# Patient Record
Sex: Female | Born: 1975
Health system: Southern US, Community
[De-identification: ages and names within clinical notes are randomized; demographics above are authoritative.]

## PROBLEM LIST (undated history)

## (undated) ENCOUNTER — Ambulatory Visit: Payer: 59

## (undated) DIAGNOSIS — E119 Type 2 diabetes mellitus without complications: Secondary | ICD-10-CM

## (undated) DIAGNOSIS — O24419 Gestational diabetes mellitus in pregnancy, unspecified control: Secondary | ICD-10-CM

## (undated) DIAGNOSIS — T7840XA Allergy, unspecified, initial encounter: Secondary | ICD-10-CM

## (undated) DIAGNOSIS — I10 Essential (primary) hypertension: Secondary | ICD-10-CM

## (undated) DIAGNOSIS — L309 Dermatitis, unspecified: Secondary | ICD-10-CM

## (undated) HISTORY — DX: Gestational diabetes mellitus in pregnancy, unspecified control: O24.419

## (undated) HISTORY — DX: Dermatitis, unspecified: L30.9

## (undated) HISTORY — DX: Type 2 diabetes mellitus without complications: E11.9

## (undated) HISTORY — DX: Essential (primary) hypertension: I10

## (undated) HISTORY — PX: EYE SURGERY: SHX253

## (undated) HISTORY — PX: WISDOM TOOTH EXTRACTION: SHX21

## (undated) HISTORY — DX: Allergy, unspecified, initial encounter: T78.40XA

---

## 2002-08-20 ENCOUNTER — Other Ambulatory Visit: Admission: RE | Admit: 2002-08-20 | Discharge: 2002-08-20 | Payer: Self-pay | Admitting: Internal Medicine

## 2004-02-04 ENCOUNTER — Other Ambulatory Visit: Admission: RE | Admit: 2004-02-04 | Discharge: 2004-02-04 | Payer: Self-pay | Admitting: Obstetrics and Gynecology

## 2004-03-03 ENCOUNTER — Inpatient Hospital Stay (HOSPITAL_COMMUNITY): Admission: AD | Admit: 2004-03-03 | Discharge: 2004-03-05 | Payer: Self-pay | Admitting: Obstetrics and Gynecology

## 2004-04-27 ENCOUNTER — Ambulatory Visit (HOSPITAL_COMMUNITY): Admission: RE | Admit: 2004-04-27 | Discharge: 2004-04-27 | Payer: Self-pay | Admitting: Obstetrics and Gynecology

## 2004-07-02 ENCOUNTER — Ambulatory Visit (HOSPITAL_COMMUNITY): Admission: RE | Admit: 2004-07-02 | Discharge: 2004-07-02 | Payer: Self-pay | Admitting: Obstetrics and Gynecology

## 2004-07-08 ENCOUNTER — Ambulatory Visit (HOSPITAL_COMMUNITY): Admission: RE | Admit: 2004-07-08 | Discharge: 2004-07-08 | Payer: Self-pay | Admitting: Obstetrics and Gynecology

## 2004-07-13 ENCOUNTER — Ambulatory Visit (HOSPITAL_COMMUNITY): Admission: RE | Admit: 2004-07-13 | Discharge: 2004-07-13 | Payer: Self-pay | Admitting: Obstetrics and Gynecology

## 2004-07-16 ENCOUNTER — Ambulatory Visit (HOSPITAL_COMMUNITY): Admission: RE | Admit: 2004-07-16 | Discharge: 2004-07-16 | Payer: Self-pay | Admitting: Obstetrics and Gynecology

## 2004-07-22 ENCOUNTER — Ambulatory Visit (HOSPITAL_COMMUNITY): Admission: RE | Admit: 2004-07-22 | Discharge: 2004-07-22 | Payer: Self-pay | Admitting: Obstetrics and Gynecology

## 2004-08-05 ENCOUNTER — Ambulatory Visit (HOSPITAL_COMMUNITY): Admission: RE | Admit: 2004-08-05 | Discharge: 2004-08-05 | Payer: Self-pay | Admitting: Obstetrics and Gynecology

## 2004-08-09 ENCOUNTER — Ambulatory Visit (HOSPITAL_COMMUNITY): Admission: RE | Admit: 2004-08-09 | Discharge: 2004-08-09 | Payer: Self-pay | Admitting: Obstetrics and Gynecology

## 2004-08-12 ENCOUNTER — Inpatient Hospital Stay (HOSPITAL_COMMUNITY): Admission: AD | Admit: 2004-08-12 | Discharge: 2004-08-15 | Payer: Self-pay | Admitting: Obstetrics and Gynecology

## 2004-08-16 ENCOUNTER — Encounter: Admission: RE | Admit: 2004-08-16 | Discharge: 2004-09-15 | Payer: Self-pay | Admitting: Obstetrics and Gynecology

## 2004-09-16 ENCOUNTER — Encounter: Admission: RE | Admit: 2004-09-16 | Discharge: 2004-10-16 | Payer: Self-pay | Admitting: Obstetrics and Gynecology

## 2004-11-16 ENCOUNTER — Encounter: Admission: RE | Admit: 2004-11-16 | Discharge: 2004-12-16 | Payer: Self-pay | Admitting: Obstetrics and Gynecology

## 2004-12-17 ENCOUNTER — Encounter: Admission: RE | Admit: 2004-12-17 | Discharge: 2005-01-15 | Payer: Self-pay | Admitting: Obstetrics and Gynecology

## 2005-01-16 ENCOUNTER — Encounter: Admission: RE | Admit: 2005-01-16 | Discharge: 2005-02-15 | Payer: Self-pay | Admitting: Obstetrics and Gynecology

## 2005-02-16 ENCOUNTER — Encounter: Admission: RE | Admit: 2005-02-16 | Discharge: 2005-03-17 | Payer: Self-pay | Admitting: Obstetrics and Gynecology

## 2005-02-19 IMAGING — US US OB DETAIL+14 WK
1 series · 13 of 28 positions shown · non-contrast
Comparison: none

CLINICAL DATA: 23 week 2 day gestational age by LMP.  Insulin dependent diabetes.

[Series 1: us ob detail+14 wk · 0.31mm/px · 13 of 89 slices shown]
[im 4/89]
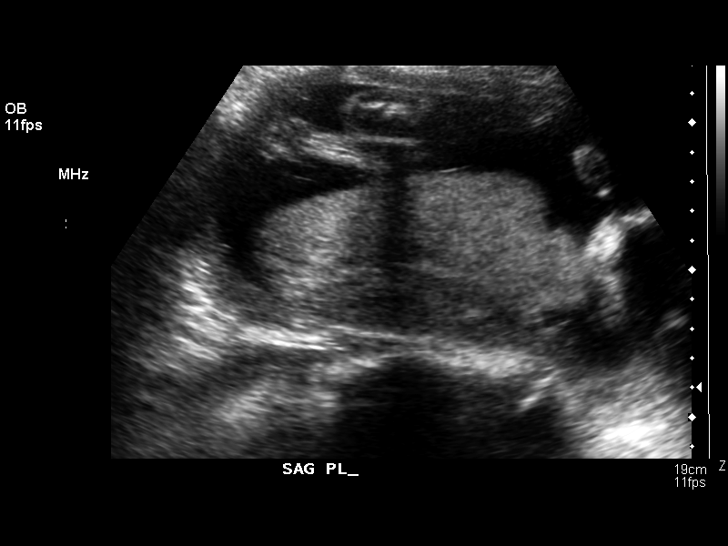
[im 10/89]
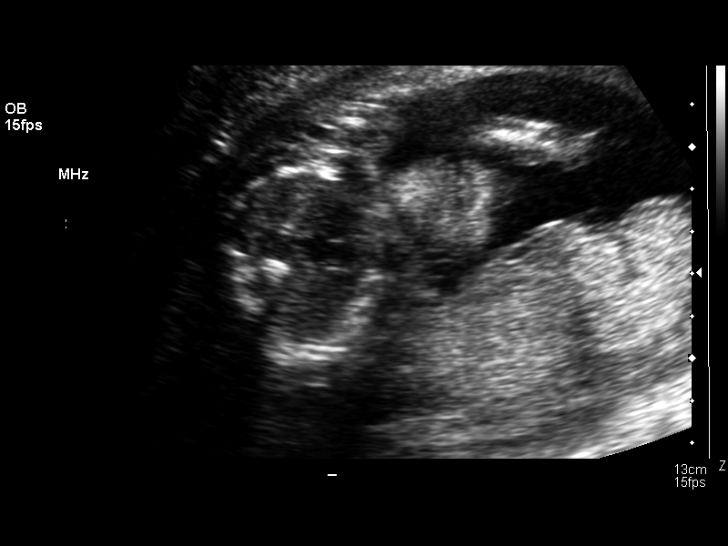
[im 17/89]
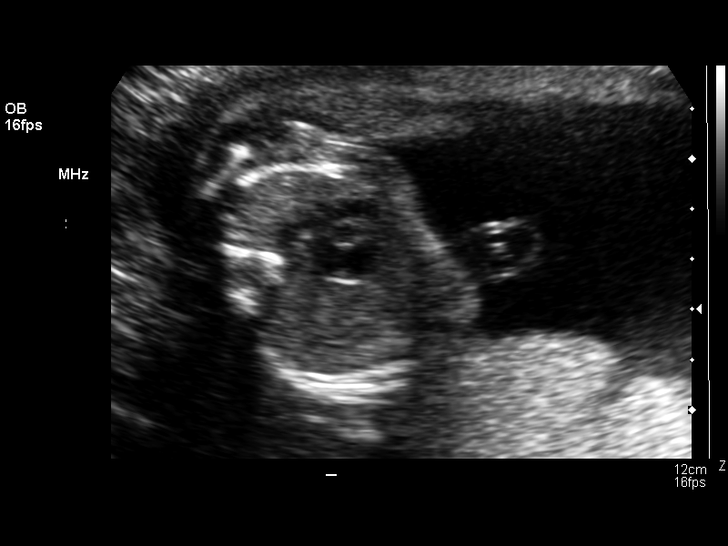
[im 23/89]
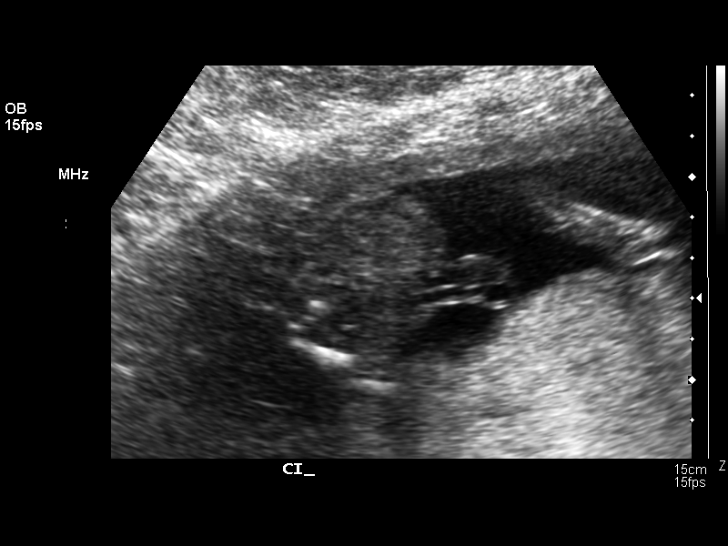
[im 30/89]
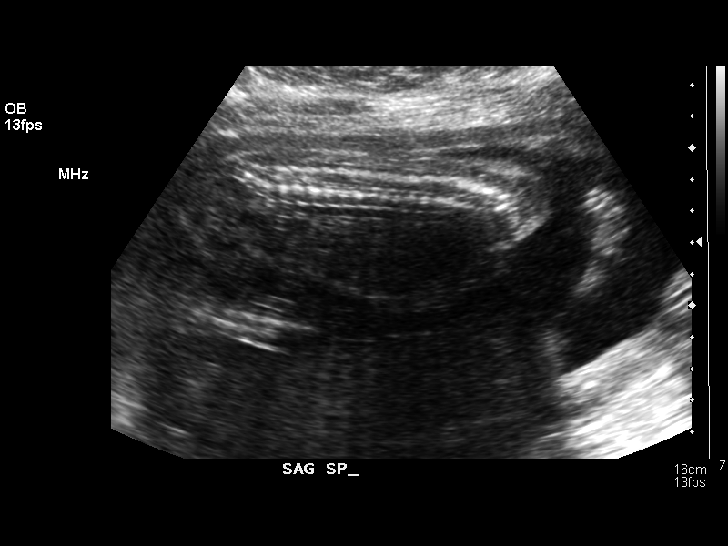
[im 36/89]
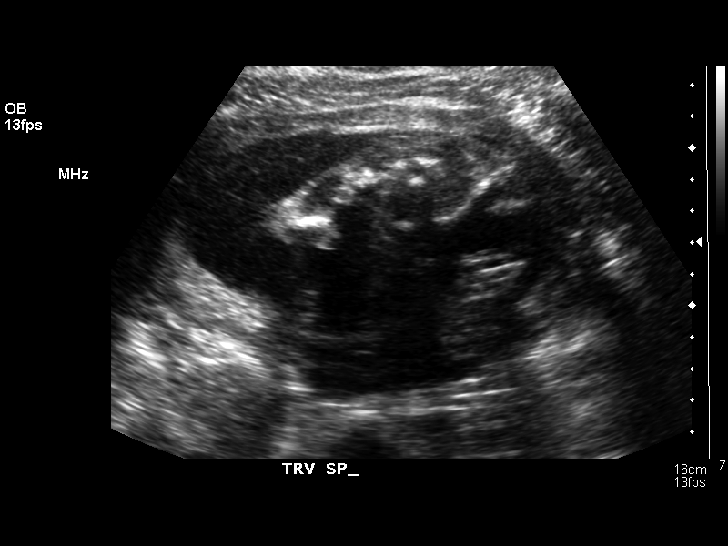
[im 46/89]
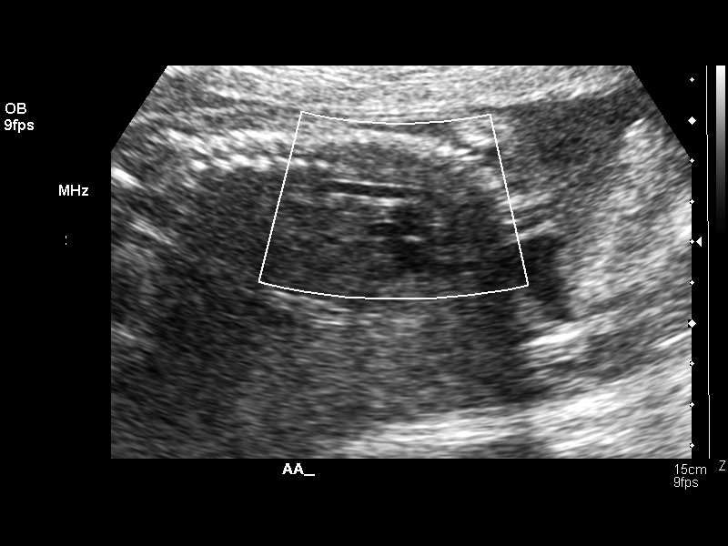
[im 53/89]
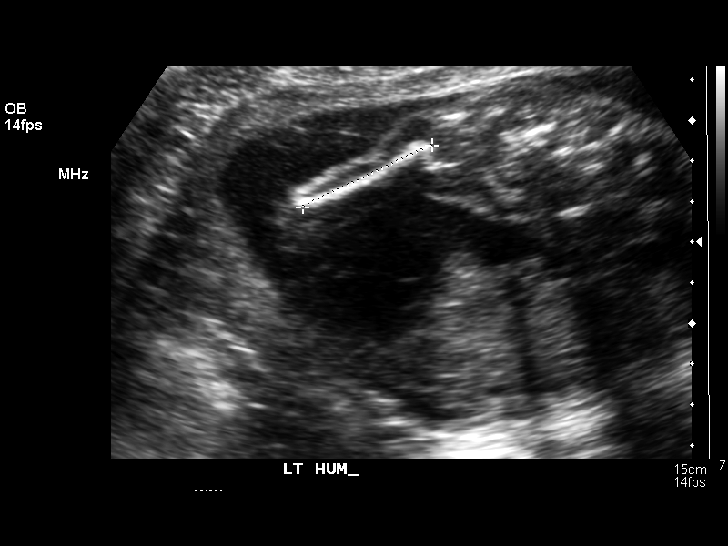
[im 59/89]
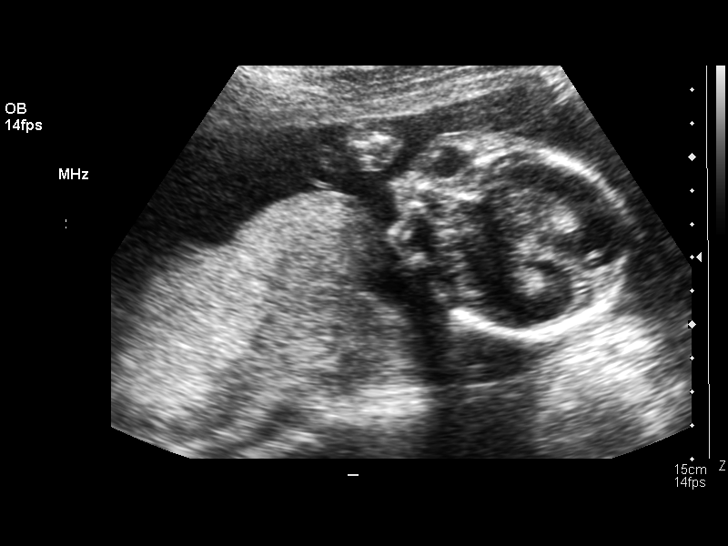
[im 66/89]
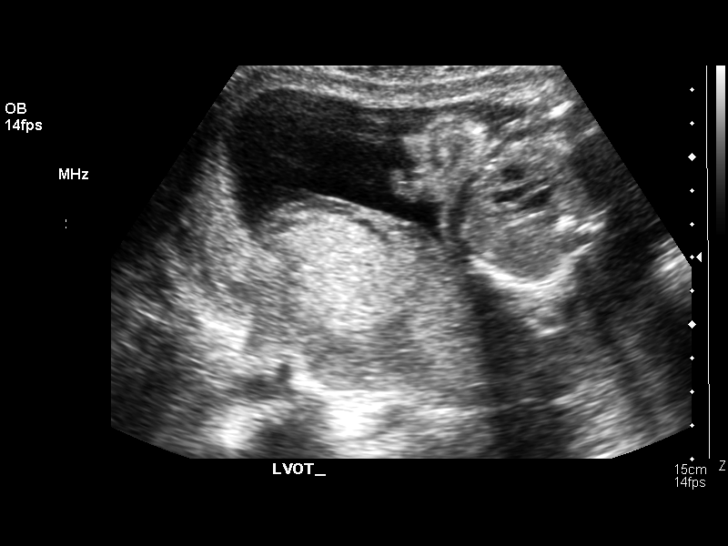
[im 72/89]
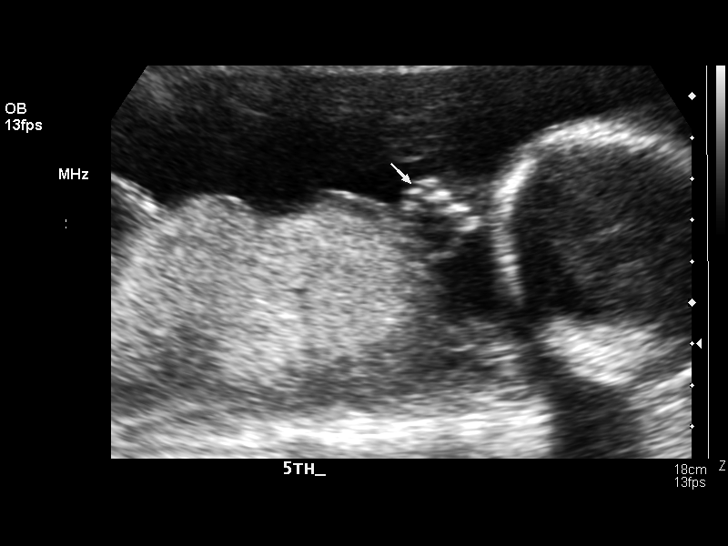
[im 79/89]
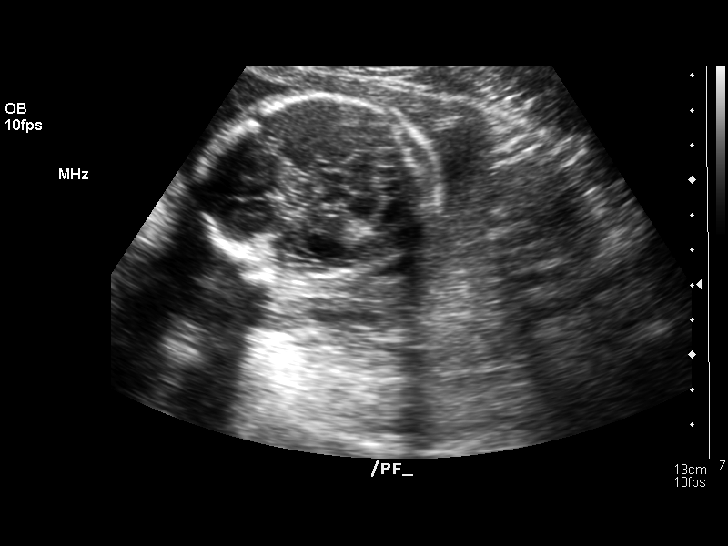
[im 85/89]
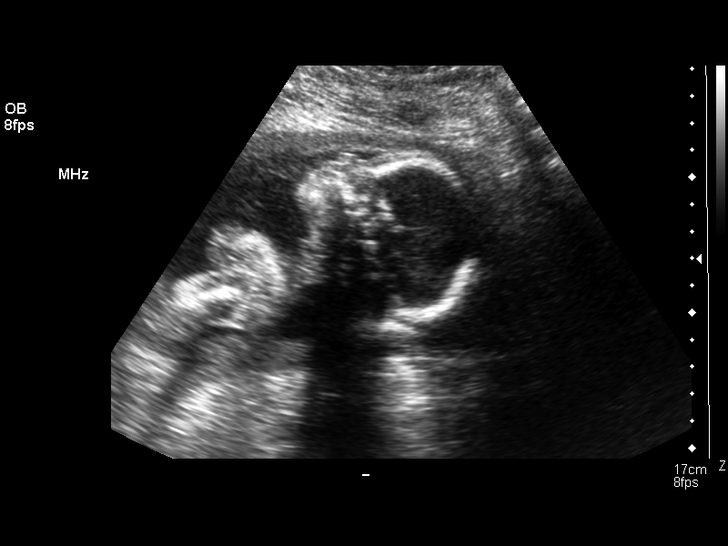

[13 of 28 positions shown; findings below may reference images not displayed]

DETAILED OBSTETRICAL ULTRASOUND:

Number of Fetuses: 1
Heart Rate:  150
Movement:  Yes
Breathing:  No
Presentation:  Cephalic
Placental Location:  Posterior
Grade:  I
Previa:  No
Amniotic Fluid (Subjective):  Normal
Amniotic Fluid (Objective):  4.2 cm Vertical pocket 

FETAL BIOMETRY
BPD:  5.5 cm  22 w 6 d
HC:   20.3 cm   22 w 3 d
AC:  17.4 cm   22 w 2 d
FL:  3.9 cm   22 w 4 d
HL:  3.7 cm  22 w 5 d

MEAN GA:  22 w 4 d

FETAL ANATOMY
Lateral Ventricles:  Visualized  
Thalami/CSP:  Visualized 
Posterior Fossa:  Visualized 
Nuchal Region:  N/A
Spine:  Visualized  
4 Chamber Heart on Left:  Visualized 
Stomach on Left:  Visualized 
3 Vessel Cord:  Visualized 
Cord Insertion Site:  Visualized 
Kidneys:  Visualized 
Bladder:  Visualized 
Extremities:  Visualized 

ADDITIONAL ANATOMY VISUALIZED:  LVOT, RVOT, upper lip, orbits, profile, diaphragm, 5th digit, male genitalia, and nasal bone.

Evaluation limited by:  Fetal position

MATERNAL UTERINE AND ADNEXAL FINDINGS
Cervix:  3.8 cm Transabdominally
IMPRESSION: 1.  Single living intrauterine fetus with mean gestational age of 22 weeks 4 days and sonographic EDC of 08/27/04.  This is within one week of LMP dating.  
2.  No evidence of fetal anatomic abnormality.  

</u12:p>

## 2005-03-18 ENCOUNTER — Encounter: Admission: RE | Admit: 2005-03-18 | Discharge: 2005-04-17 | Payer: Self-pay | Admitting: Obstetrics and Gynecology

## 2005-04-18 ENCOUNTER — Encounter: Admission: RE | Admit: 2005-04-18 | Discharge: 2005-05-18 | Payer: Self-pay | Admitting: Obstetrics and Gynecology

## 2005-05-02 IMAGING — US US OB FOLLOW-UP
1 series · 13 of 28 positions shown · non-contrast
Comparison: none

CLINICAL DATA: Estimated fetal weight and biophysical profile.

[Series 1: us ob follow-up · 13 of 86 slices shown]
[im 4/86]
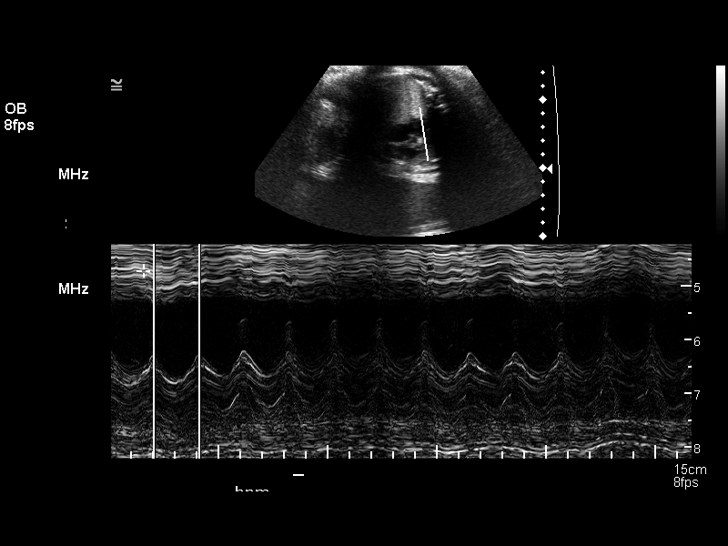
[im 10/86]
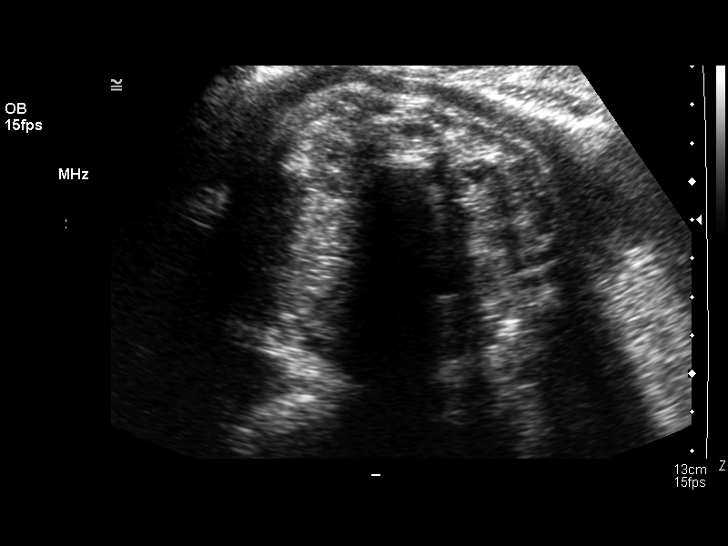
[im 16/86]
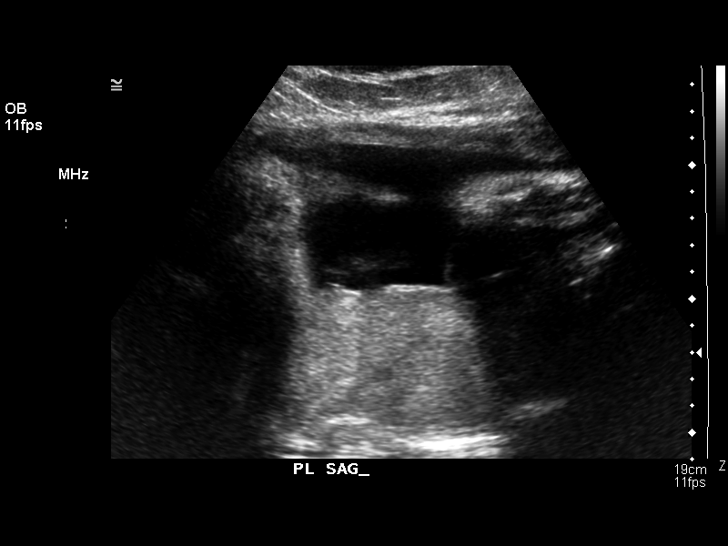
[im 23/86]
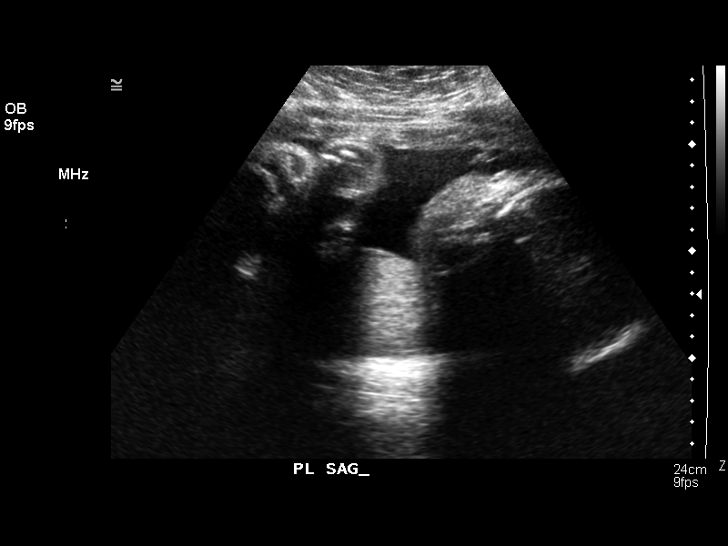
[im 29/86]
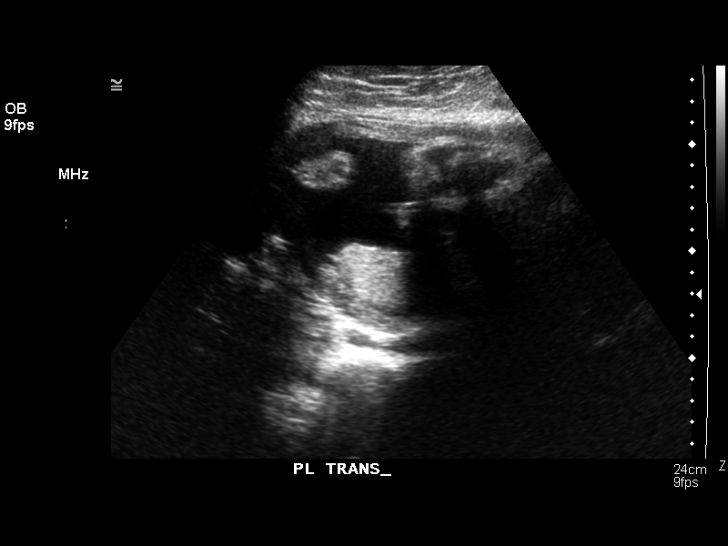
[im 35/86]
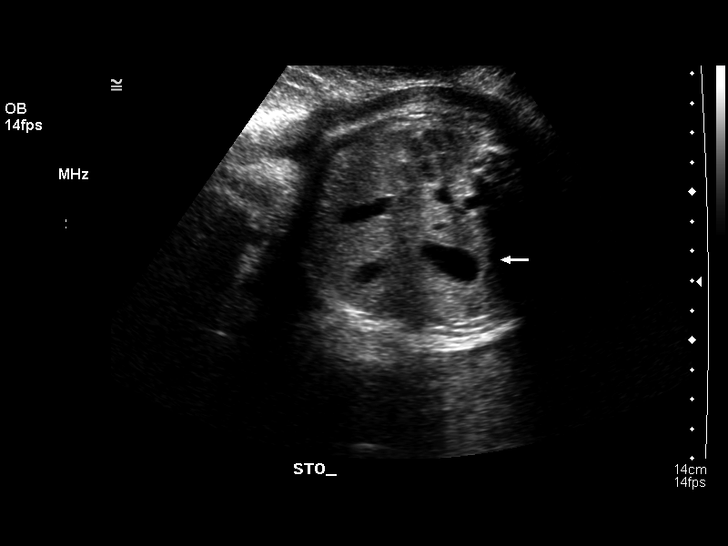
[im 45/86]
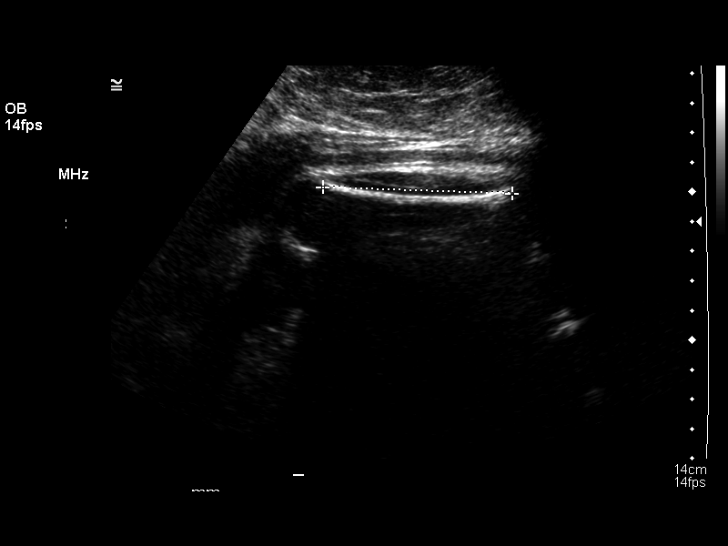
[im 51/86]
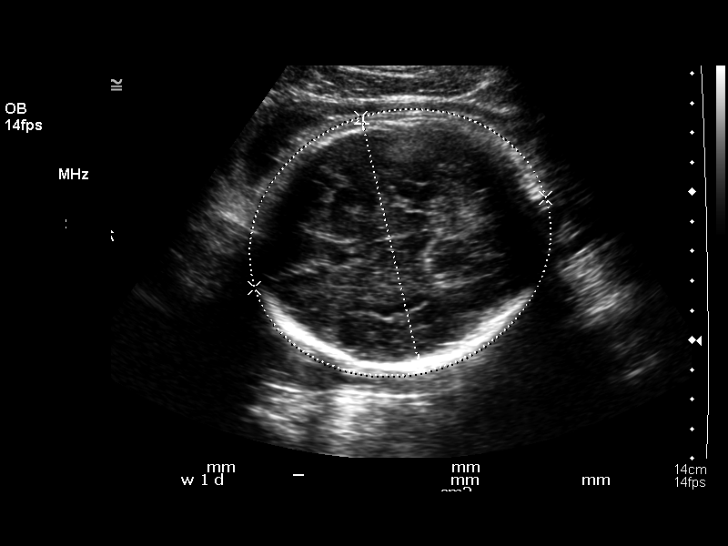
[im 57/86]
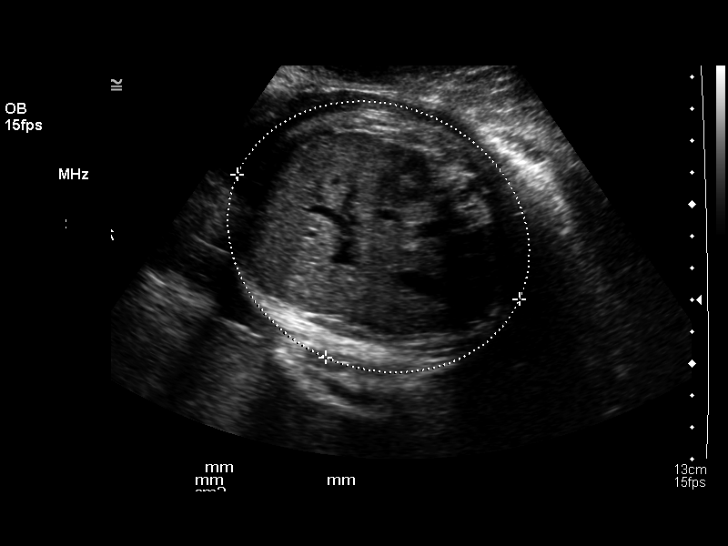
[im 63/86]
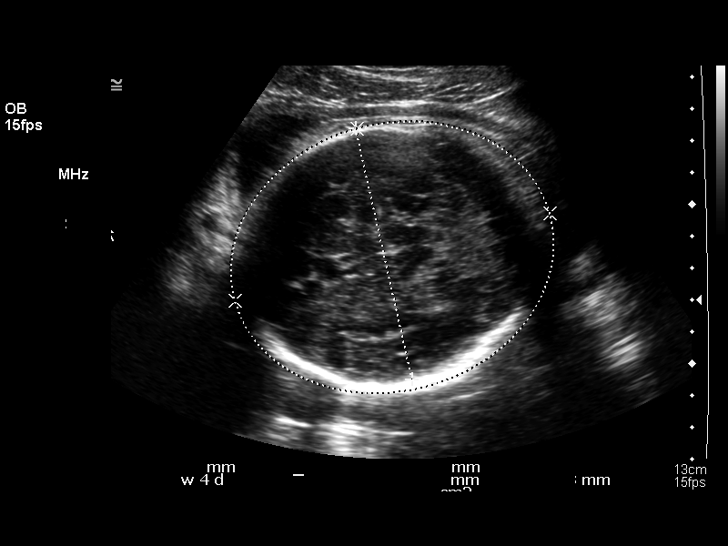
[im 70/86]
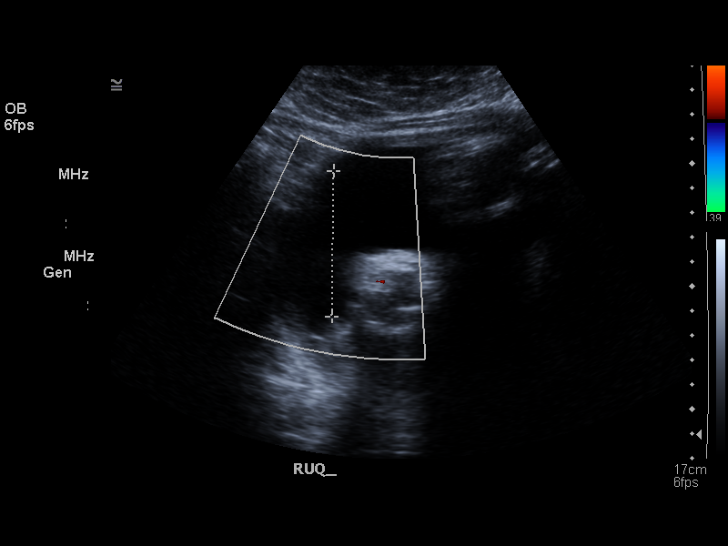
[im 76/86]
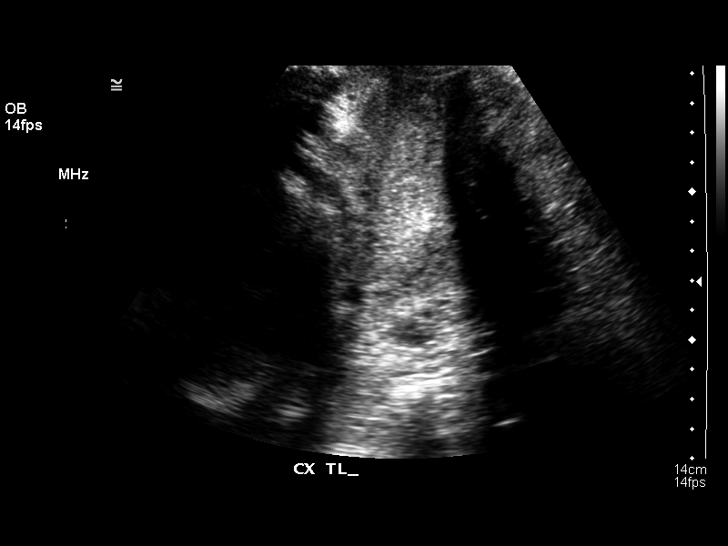
[im 82/86]
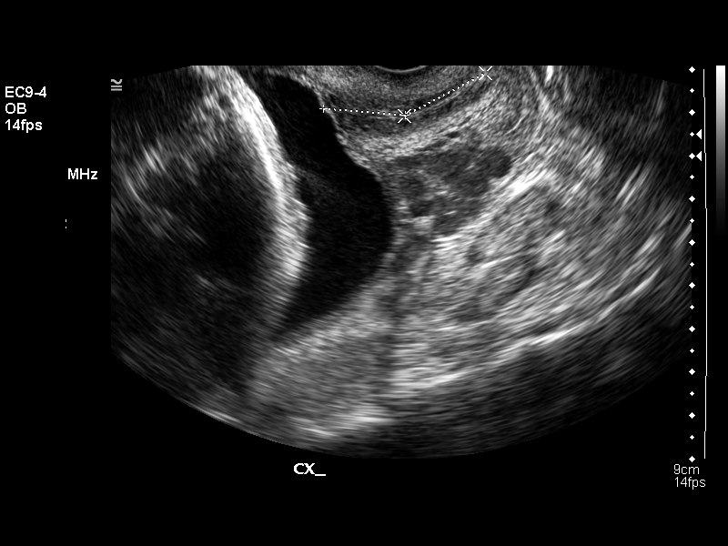

[13 of 28 positions shown; findings below may reference images not displayed]

OBSTETRICAL ULTRASOUND RE-EVALUATION WITH TRANSVAGINAL:
 Number of Fetuses:  1
 Heart Rate:  144
 Movement:  Yes
 Breathing:  Yes
 Presentation:  Cephalic
 Placental Location:  Posterior
 Grade:  II
 Previa:  No
 Amniotic Fluid (subjective):  Normal
 Amniotic Fluid (objective):  17.0 cm AFI (5th -95th%ile = 8.3 – 24.5 cm for 33 wks)

 FETAL BIOMETRY
 BPD:  8.3 cm   33 w 2 d
 HC:  29.7 cm   32 w 6 d
 AC:  28.3 cm   32 w 2 d
 FL:   6.4 cm   32 w 6 d

 Mean GA:  32 w 6 d
 Assigned GA:  33 w 4 d

 EFW:  4446 g (H) 50th – 75th%ile (9593 – 3914 g) For 33 wks

 FETAL ANATOMY
 Lateral Ventricles:  Previously seen 
 Thalami/CSP:  Previously seen 
 Posterior Fossa:  Previously seen 
 Nuchal Region:  Previously seen 
 Spine:  Previously seen 
 4 Chamber Heart on Left:  Visualized 
 Stomach on Left:  Visualized 
 3 Vessel Cord:  Previously seen 
 Cord Insertion Site:  Previously seen 
 Kidneys:  Visualized 
 Bladder:  Visualized 
 Extremities:  Previously seen 

 Evaluation limited by:  Maternal habitus and advanced gestational age.

 MATERNAL UTERINE AND ADNEXAL FINDINGS
 Cervix:  4.0 cm Transvaginally

 BIOPHYSICAL PROFILE

 Movement:  2    Time:  30 minutes
 Breathing:  2
 Tone:  2
 Amniotic Fluid:  2

 Total Score:  8
IMPRESSION: 1.  Single living intrauterine fetus in cephalic presentation with subjectively and quantitatively normal amniotic fluid volume.  Interval growth has been appropriate.
 2.  Visualized fetal anatomy is unremarkable although exam is limited by the advanced gestational age and maternal habitus.
 3.  Biophysical profile score is [DATE] after 30 minutes of observation.

## 2005-05-16 IMAGING — US US FETAL BPP W/O NONSTRESS
1 series · 14 of 19 positions shown · non-contrast
Comparison: none

CLINICAL DATA: Hypertension and diabetes.  Assess fetal well-being.

[Series 1: us fetal bpp w/o nonstress · 0.41mm/px · 19 acquisitions, 14 frames shown]
[im 1/19]
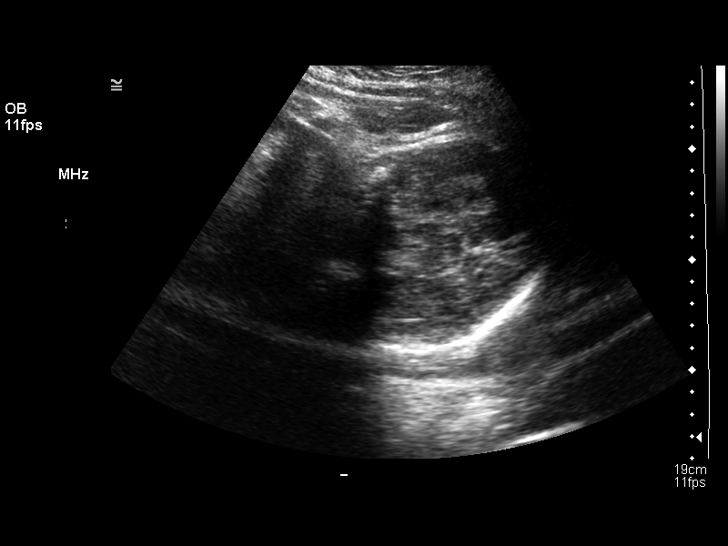
[im 3/19]
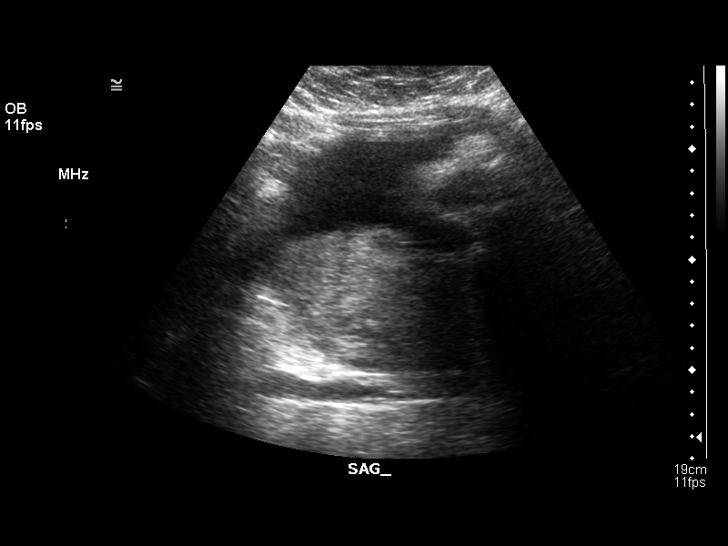
[im 4/19]
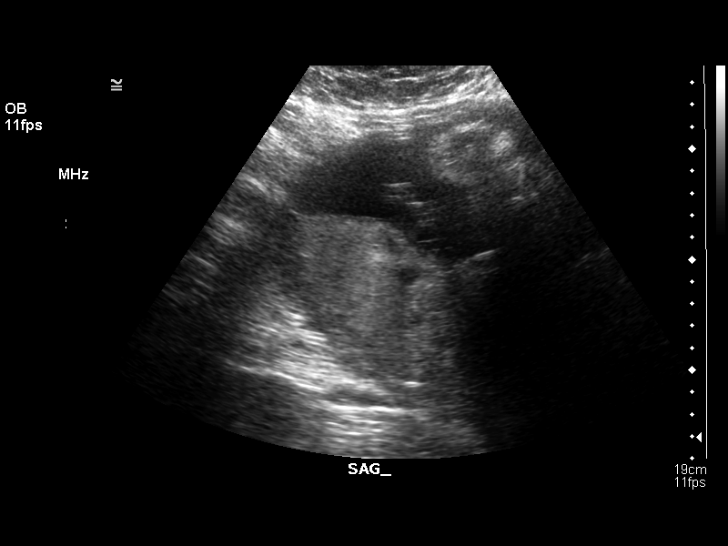
[im 5/19]
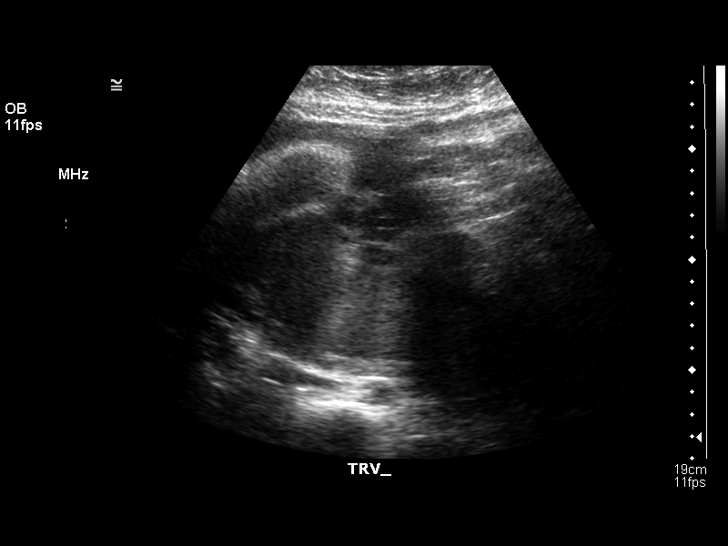
[im 7/19]
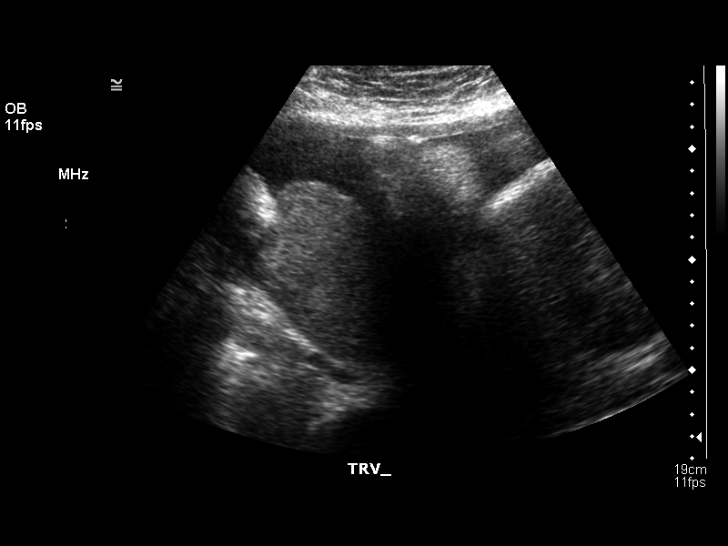
[im 8/19]
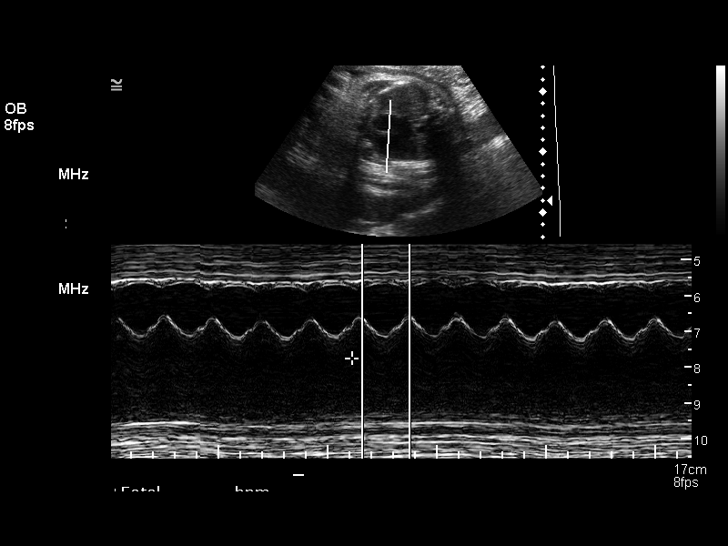
[im 9/19]
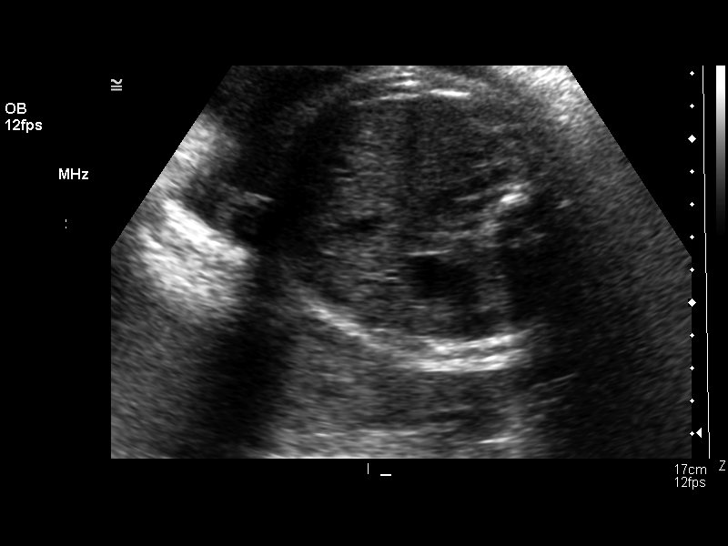
[im 11/19]
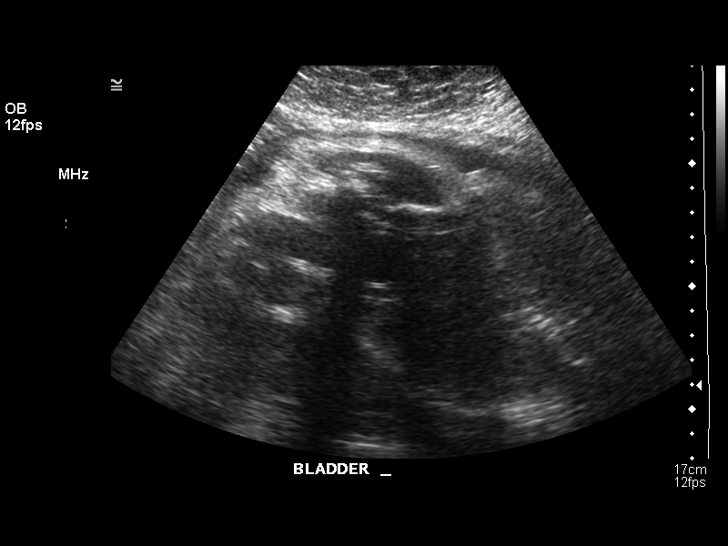
[im 12/19]
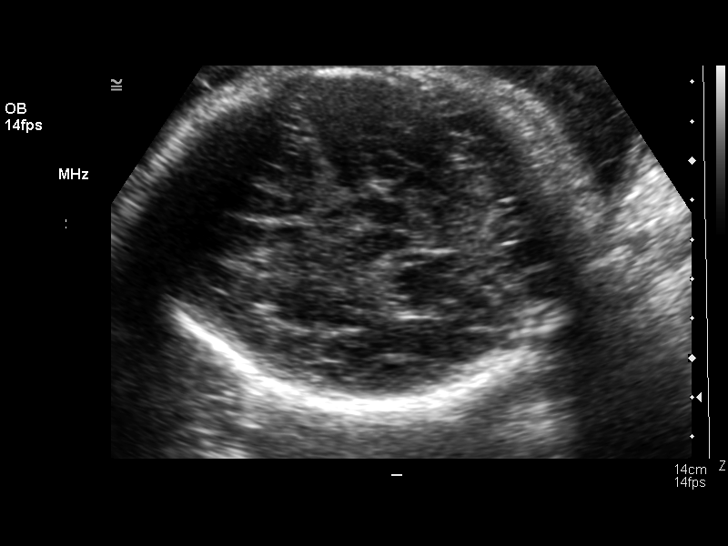
[im 13/19]
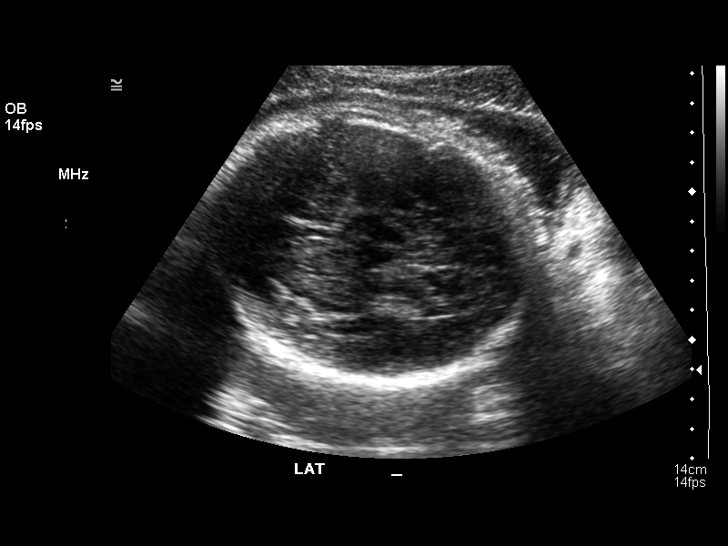
[im 15/19]
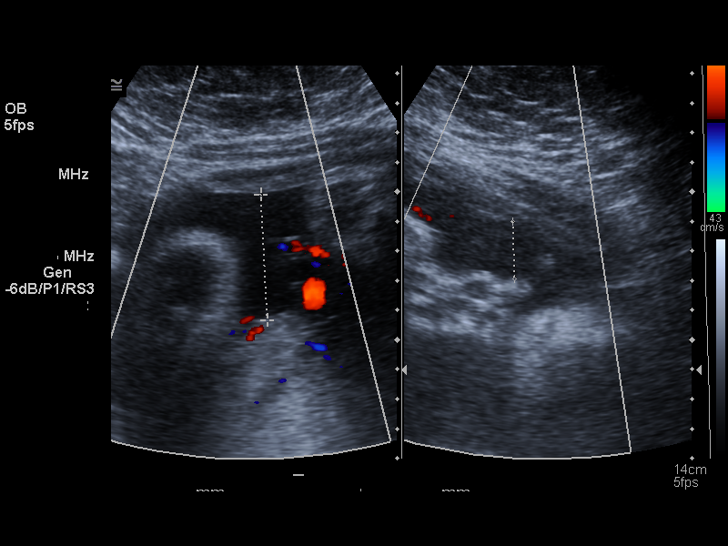
[im 16/19]
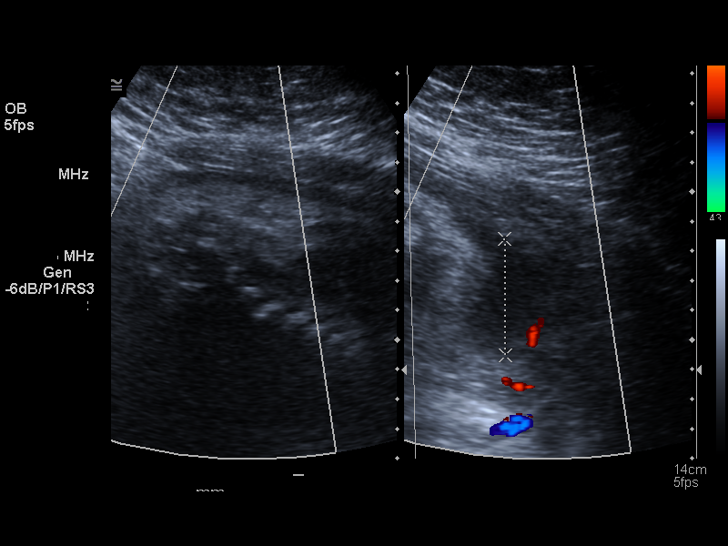
[im 17/19]
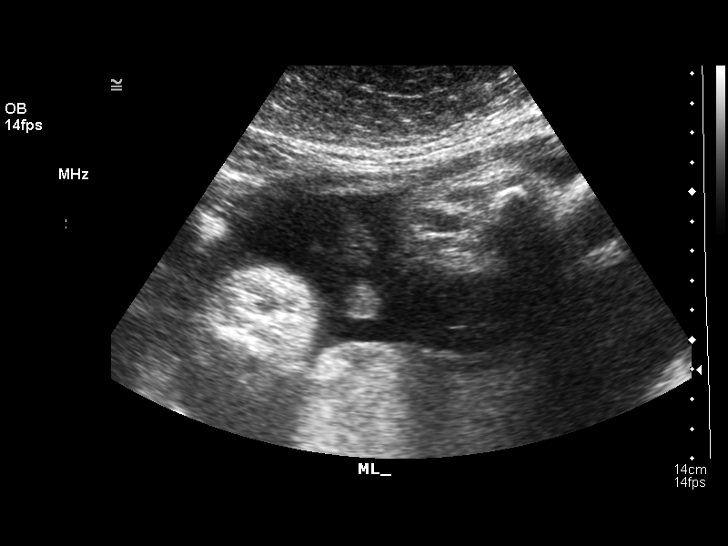
[im 19/19]
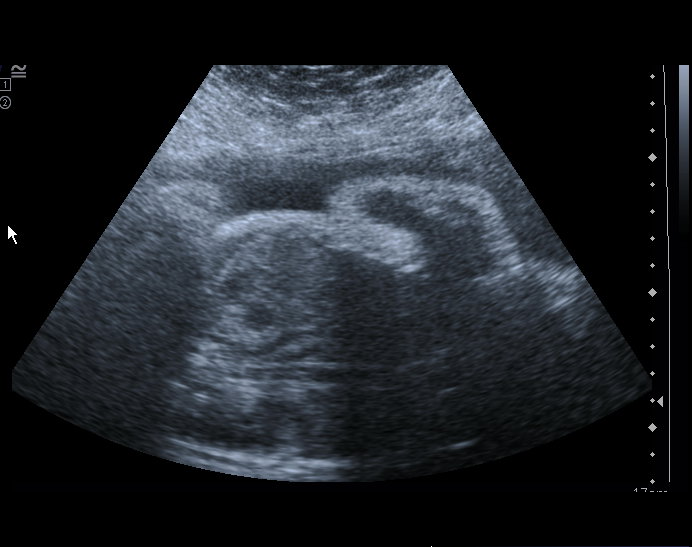

[14 of 19 positions shown; findings below may reference images not displayed]

BIOPHYSICAL PROFILE

Number of Fetuses:  1
Heart rate:  139 bpm
Movement:  yes
Breathing:  yes
Presentation:  vertex
Placental Location:  posterior, right lateral
Grade:  1
Previa:  no
Amniotic Fluid (Subjective):  normal
Amniotic Fluid (Objective):  AFI 10.1 cm (1th-71th %ile = 7.7 to 24.9 cm for 36 weeks) 

Fetal measurements and complete anatomic evaluation were not requested.  The following fetal anatomy was visualized on this exam:  lateral ventricles, thalami, stomach, three vessel cord, kidneys, bladder, and diaphragm.

BPP SCORING
Movements:  2  Time:  15 minutes
Breathing:  2
Tone:  2
Amniotic Fluid:  2
Total Score:  8

MATERNAL UTERINE AND ADNEXAL FINDINGS
Cervix:  not evaluated; evaluated in office on [REDACTED] per patient
IMPRESSION: 1.  Biophysical profile score [DATE].
2.  Subjectively and quantitatively normal amniotic fluid volume.
3.  No late developing fetal anatomic abnormalities are noted associated with the lateral ventricles, kidneys, or stomach.  A four-chamber heart view cannot be seen with confidence due to positioning on today?s exam.

## 2005-05-19 ENCOUNTER — Encounter: Admission: RE | Admit: 2005-05-19 | Discharge: 2005-06-15 | Payer: Self-pay | Admitting: Obstetrics and Gynecology

## 2005-06-03 IMAGING — US US FETAL BPP W/O NONSTRESS
1 series · 14 of 26 positions shown · non-contrast
Comparison: none

CLINICAL DATA: Non-reactive NST.   Assess fetal well-being.

[Series 1: us fetal bpp w/o nonstress · 0.33mm/px · 14 of 26 slices shown]
[im 1/26]
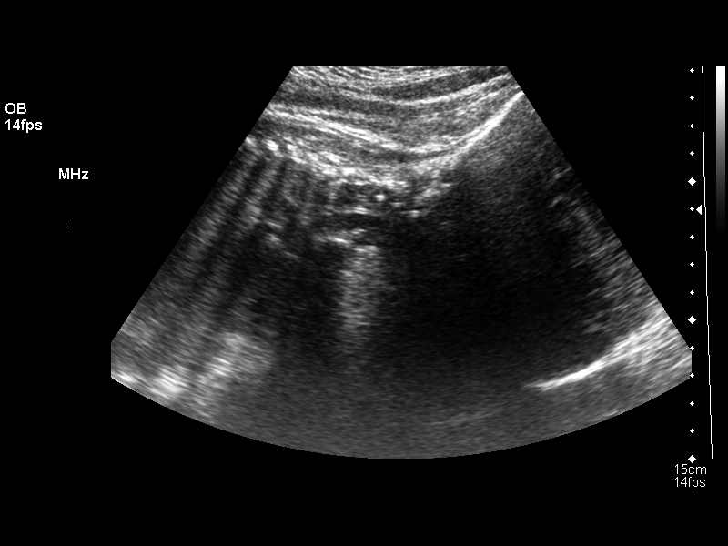
[im 3/26]
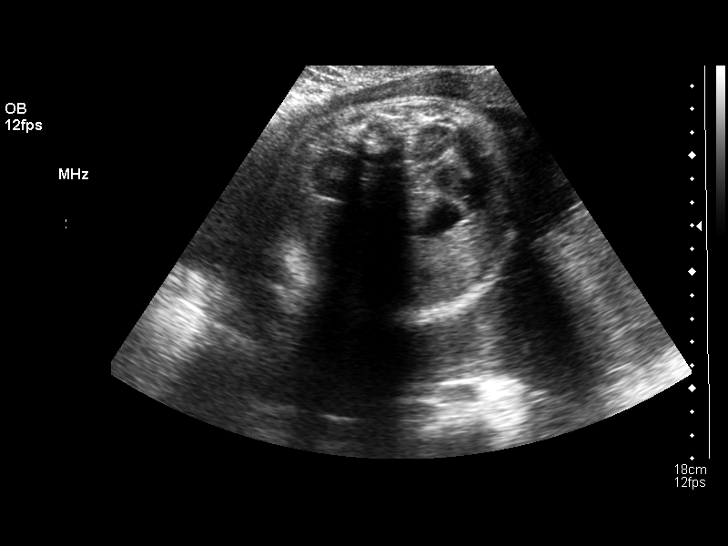
[im 5/26]
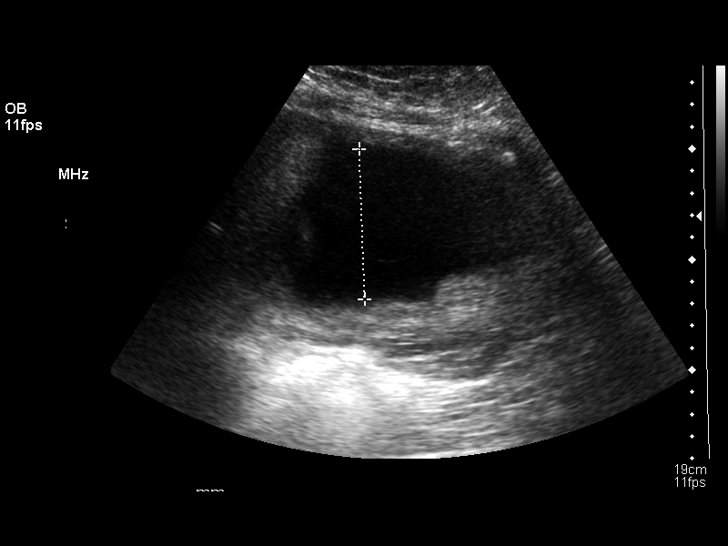
[im 7/26]
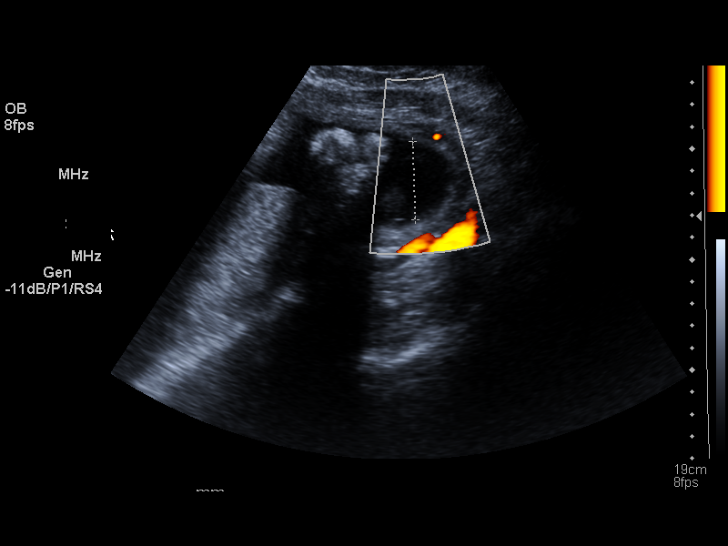
[im 9/26]
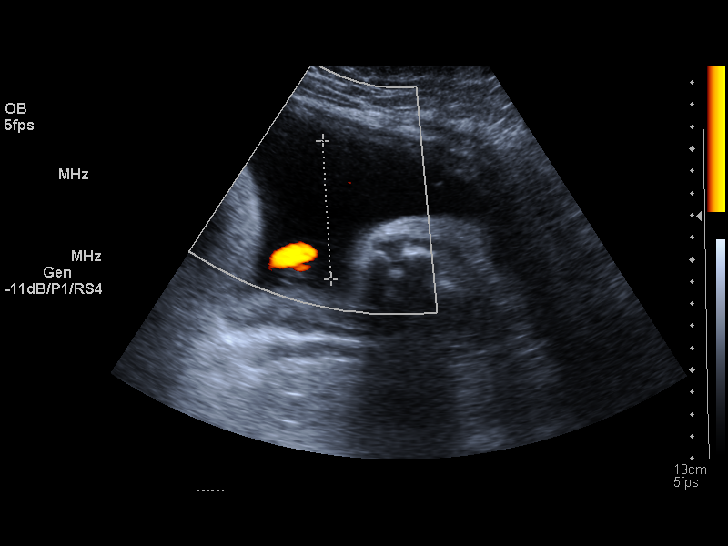
[im 11/26]
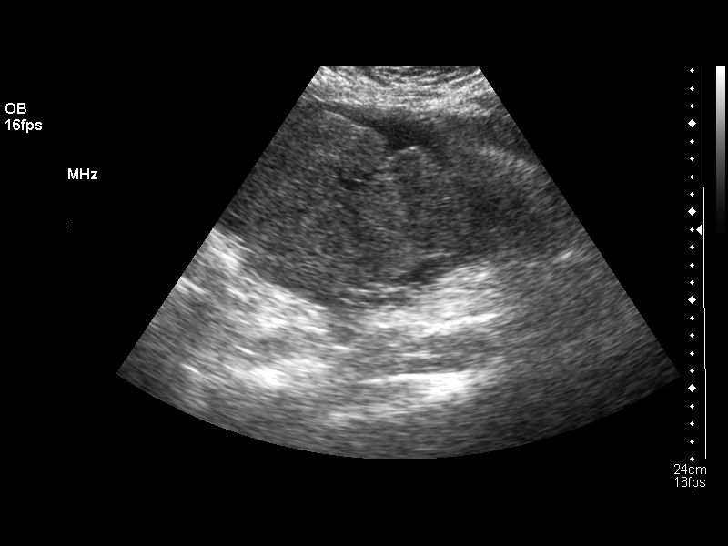
[im 13/26]
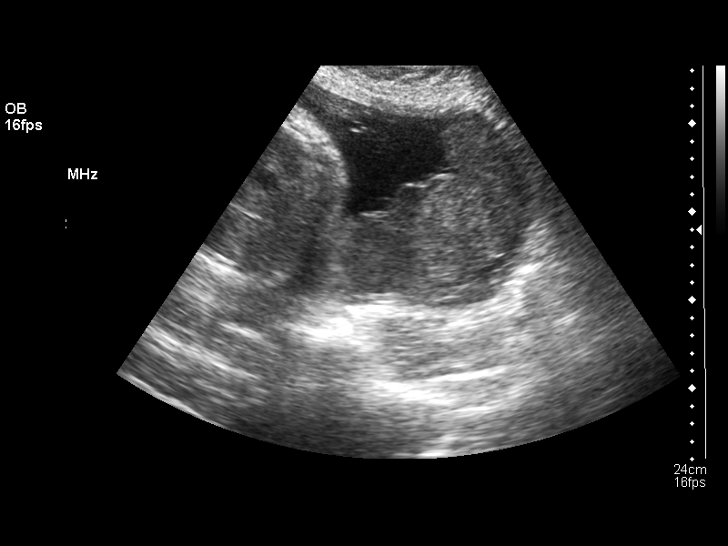
[im 14/26]
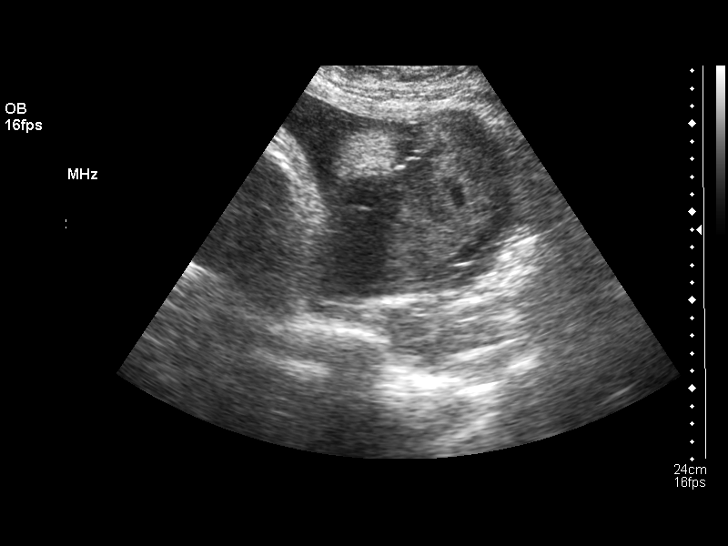
[im 16/26]
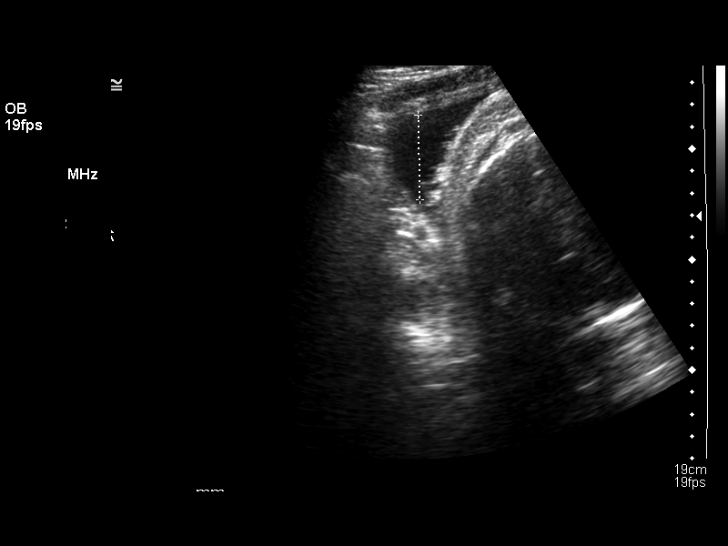
[im 18/26]
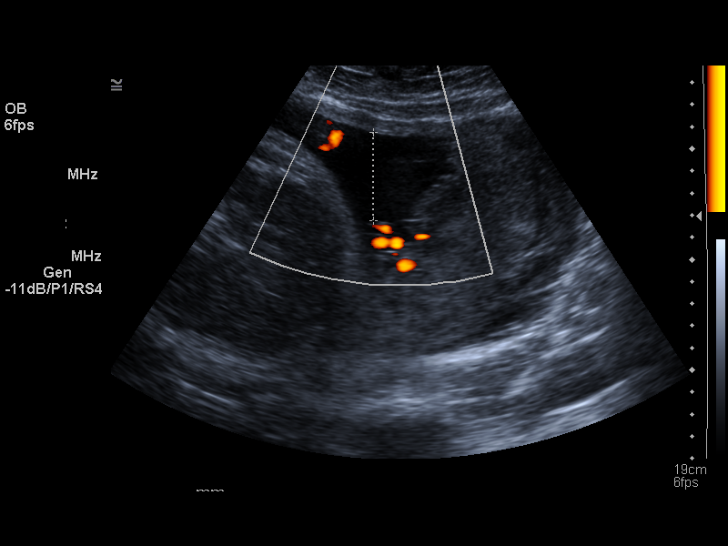
[im 20/26]
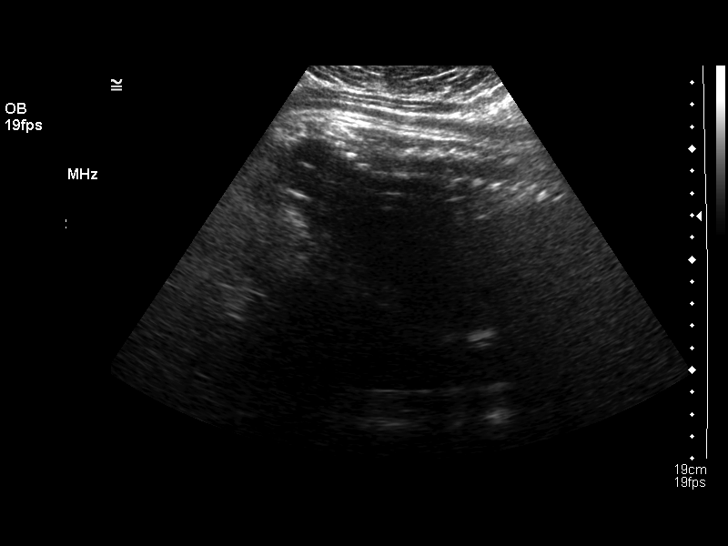
[im 22/26]
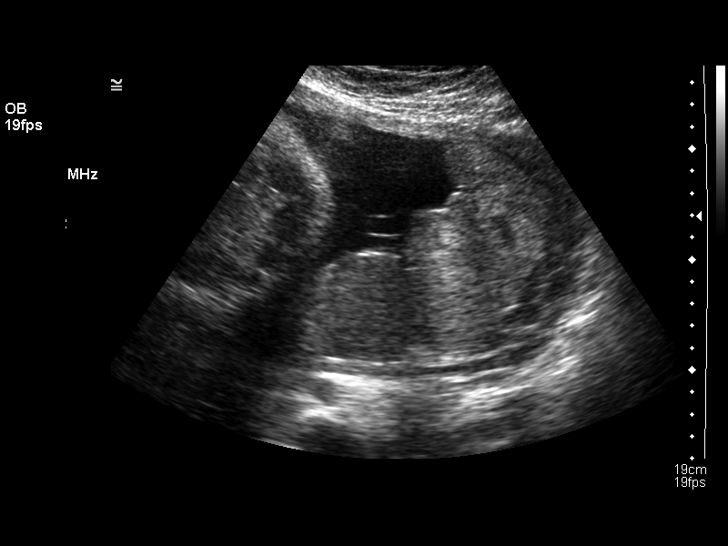
[im 24/26]
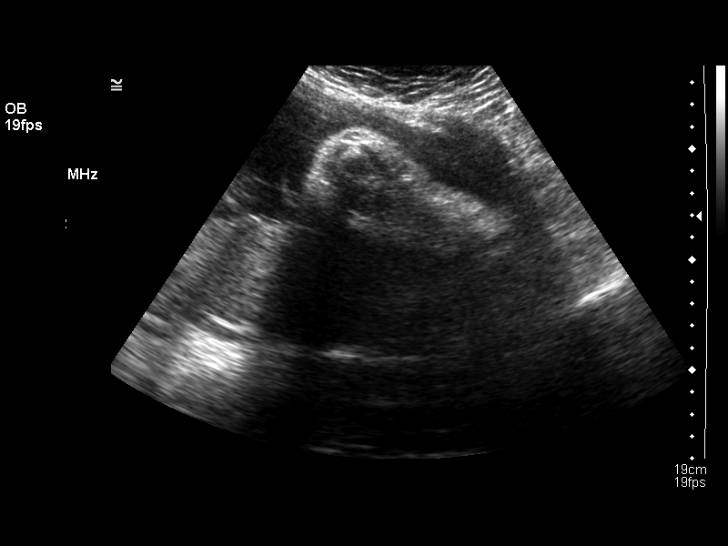
[im 26/26]
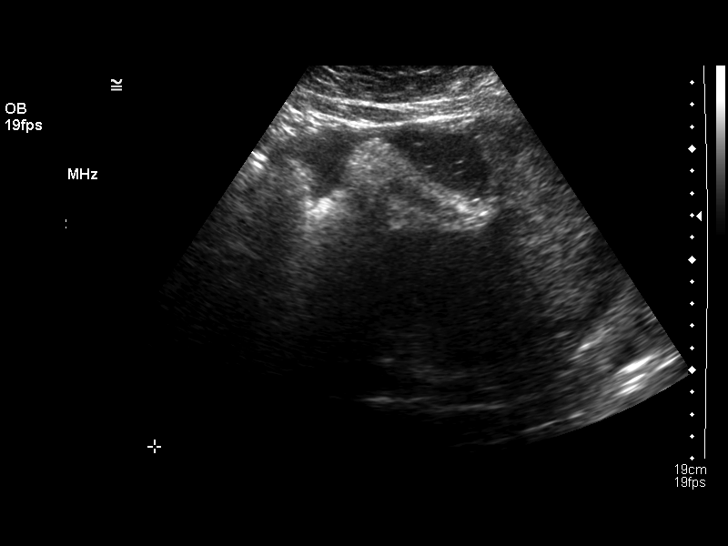

[14 of 26 positions shown; findings below may reference images not displayed]

BIOPHYSICAL PROFILE

Number of Fetuses:  1
Heart rate:  136
Movement:  Yes
Breathing:  Yes
Presentation:  Cephalic
Placental Location:  Fundal, posterior and left lateral 
Grade:  II
Previa:  No
Amniotic Fluid (Subjective):  Normal
Amniotic Fluid (Objective):  16.2 cm AFI (5th -95th%ile = 7.3 ? 23.9 cm for 38 wks)

Fetal measurements and complete anatomic evaluation were not requested.  The following fetal anatomy was visualized on this exam:  Stomach, kidneys and bladder.

BPP SCORING
Movements:  2  Time: 20 minutes
Breathing:  2
Tone:  2
Amniotic Fluid:  2
Total Score:  8

MATERNAL UTERINE AND ADNEXAL FINDINGS
Cervix:  Not evaluated.
IMPRESSION: 1.  Subjectively and quantitatively normal amniotic fluid volume.
2.  Biophysical profile score [DATE].
3.  No late developing fetal anatomic abnormalities are identified associated with the stomach, kidneys or bladder.  The lateral ventricles and four chamber heart view could not be seen with confidence due to positioning on today?s exam. 
4.  Today?s scan findings were called to Dr. Pencil?[REDACTED].

## 2005-06-16 ENCOUNTER — Encounter: Admission: RE | Admit: 2005-06-16 | Discharge: 2005-07-16 | Payer: Self-pay | Admitting: Obstetrics and Gynecology

## 2005-07-17 ENCOUNTER — Encounter: Admission: RE | Admit: 2005-07-17 | Discharge: 2005-08-15 | Payer: Self-pay | Admitting: Obstetrics and Gynecology

## 2005-08-16 ENCOUNTER — Encounter: Admission: RE | Admit: 2005-08-16 | Discharge: 2005-09-15 | Payer: Self-pay | Admitting: Obstetrics and Gynecology

## 2008-05-16 ENCOUNTER — Emergency Department (HOSPITAL_COMMUNITY): Admission: EM | Admit: 2008-05-16 | Discharge: 2008-05-16 | Payer: Self-pay | Admitting: Family Medicine

## 2010-04-16 ENCOUNTER — Emergency Department (HOSPITAL_COMMUNITY)
Admission: EM | Admit: 2010-04-16 | Discharge: 2010-04-16 | Payer: Self-pay | Source: Home / Self Care | Admitting: Family Medicine

## 2010-05-02 ENCOUNTER — Encounter: Payer: Self-pay | Admitting: Obstetrics and Gynecology

## 2010-08-27 NOTE — Discharge Summary (Signed)
NAME:  CHARLISE, GIOVANETTI          ACCOUNT NO.:  [id number]   MEDICAL RECORD NO.:  [id number]          PATIENT TYPE:  INP   LOCATION:  9303                          FACILITY:  [facility]   PHYSICIAN:  Naima A. Dillard, M.D. DATE OF BIRTH:  11-08-1975   DATE OF ADMISSION:  03/03/2004  DATE OF DISCHARGE:  03/05/2004                                 DISCHARGE SUMMARY   ADDENDUM:   DISCHARGE MEDICATIONS:  Her discharge medications are 17 units of Regular  insulin plus 9 units of NPH insulin every a.m., in addition to her 12 units  of NPH insulin at h.s.     Larey Brick   MLW/MEDQ  D:  03/06/2004  T:  03/06/2004  Job:  [id number]

## 2010-08-27 NOTE — H&P (Signed)
NAMEMONAE, TOPPING          ACCOUNT NO.:  [id number]   MEDICAL RECORD NO.:  [id number]           PATIENT TYPE:   LOCATION:                                 FACILITY:   PHYSICIAN:  Concha Pyo. Duplantis, C.N.M.DATE OF BIRTH:  [birth year]   DATE OF ADMISSION:  08/12/2004  DATE OF DISCHARGE:                                HISTORY & PHYSICAL   HISTORY OF PRESENT ILLNESS:  Ms. Patricia Estrada is a 35 year old married black  female, gravida 1, para 0, with a due date of Aug 22, 2004 by LMP, which  makes her approximately 38.[redacted] weeks gestation.  She is admitted for induction  of labor secondary to gestational hypertension versus chronic hypertension,  and type 2 diabetes diagnosed during this pregnancy, requiring insulin for  control.  She denies any regular uterine contractions.  She denies any  leaking or vaginal bleeding.  She reports positive fetal movement.  She  denies any headache, nausea, vomiting, or visual disturbances.  Her  pregnancy has been followed at [facility] by the M.D. service,  and is at risk for a history of hypertension throughout this pregnancy,  requiring Aldomet 250 mg p.o. b.i.d. since about [redacted] weeks gestation, and was  diagnosed also with insulin-dependent diabetes at about [redacted] weeks gestation.  She also has a penicillin allergy.  GBS is negative.   OBSTETRIC AND GYNECOLOGIC HISTORY:  She is a gravida 1, para 0, with an LMP  of November 16, 2003, giving her an [facility] of Aug 22, 2004.  She has a history of  possible infertility, in that it took her 8 years to conceive, but she never  had infertility treatment.   GENERAL MEDICAL HISTORY:  1.  She is not sure whether she has had the usual childhood diseases.  2.  There are no other medical problems other than possibly hypertension      prior to this pregnancy, confirmed during this pregnancy.  3.  Type 2 diabetes diagnosed during this pregnancy, requiring insulin.   ALLERGIES:  She is allergic to PENICILLIN.   She does not know what kind of  reaction it gives her, however.   FAMILY HISTORY:  Negative.   GENETIC HISTORY:  Essentially negative.  The patient's second cousin has  Down syndrome.   SOCIAL HISTORY:  She is married to [place] __________, who is involved and  supportive.  They are both employed full time.  They deny any illicit drug  use, alcohol, or smoking with this pregnancy.  They are of the [employer] [facility]  faith.   PRENATAL LABORATORIES:  Blood type is O positive, antibody screen negative,  sickle cell trait is negative.  Syphilis is nonreactive.  Rubella is  positive.  Hepatitis B surface antigen is negative.  HIV is nonreactive.  Cystic fibrosis is negative.  Pap smear is within normal limits, and a 36-  week beta strep was negative.   PHYSICAL EXAMINATION:  VITAL SIGNS:  Blood pressure 138/88.  HEENT:  Grossly within normal limits.  HEART:  Regular rhythm and rate.  CHEST:  Clear.  BREASTS:  Soft and nontender.  ABDOMEN:  Gravid.  There  are no regular uterine contractions noted.  Fetal  heart rate is reactive and reassuring.  PELVIC:  1 cm, 50%, vertex, -1, and cervix is posterior.  EXTREMITIES:  Within normal limits.   ASSESSMENT:  1.  Intrauterine pregnancy at term.  2.  Gestational hypertension.  3.  Insulin-dependent type 2 diabetes.  4.  Negative group B strep.   PLAN:  Admit for induction of labor per Dr. Dierdre Forth.  The plan is to  give her Cytotec for cervical ripening.      SJD/MEDQ  D:  08/12/2004  T:  08/13/2004  Job:  [id number]

## 2010-08-27 NOTE — H&P (Signed)
NAME:  Patricia Estrada, Patricia Estrada          ACCOUNT NO.:  [id number]   MEDICAL RECORD NO.:  [id number]          PATIENT TYPE:  INP   LOCATION:  9399                          FACILITY:  [facility]   PHYSICIAN:  Naima A. Dillard, M.D. DATE OF BIRTH:  26-Feb-1976   DATE OF ADMISSION:  DATE OF DISCHARGE:                                HISTORY & PHYSICAL   HISTORY OF PRESENT ILLNESS:  Ms. Patricia Estrada is a 35 year old gravida 1, para  0 at 15-3/7 weeks who presented to the office today with blood pressures of  158/90, a 4+ glucosuria, and a Dextrostix of 349 mg/dl.  The patient denies  any headache, visual symptoms, epigastric pain, nausea, vomiting, or  diarrhea.  She had one glass of orange juice before her visit but no other  intake.  She is to be admitted for glucose monitoring, insulin dosing, and  initiation of Aldomet.  Pregnancy has been remarkable for (1) a questionable  history of hypertension diagnosed on previous primary care visits.  She has  never been placed on any medication, and no other workup has occurred; (2)  elevated body mass index with weight today of 241; (3) PENICILLIN ALLERGY.   PRENATAL LABORATORY DATA:  Blood type is O positive, Rh antibody negative,  VDRL nonreactive, rubella titer positive, hepatitis B surface antigen  negative, HIV nonreactive, GC and Chlamydia cultures negative at the first  visit, Pap normal, cystic fibrosis testing negative, sickle cell test  negative.  Hemoglobin upon entering the practice was 11.6.  Platelets were  238.  EDC of 08/22/2004 was established by last menstrual period.   PAST OBSTETRICAL HISTORY:  The patient is primigravida.   PAST GYNECOLOGICAL HISTORY:  No contraceptive use for eight years and no  pregnancy occurred, but there had been no infertility testing.   PAST MEDICAL HISTORY:  1.  Yeast infection in 2004.  2.  She report the usual childhood illnesses.  3.  She has had some question of borderline hypertension on primary medical    evaluation in the past, but it has never been a consistent issue and she      has never been worked up for it.   ALLERGIES:  SENSITIVITY TO PENICILLIN WHICH CAUSES AN UNKNOWN REACTION.   FAMILY HISTORY:  The patient's mother is an insulin-dependent diabetic.  Genetic history is remarkable for the patient's second cousin having Downs  syndrome, and there are distant twins on her father's side.   SOCIAL HISTORY:  The patient is married to the father of the baby.  He is  involved and supportive.  His name is Leonette Most __________.  The patient has  three years of college.  She is employed as a Chief Operating Officer.  Her husband has four years of college.  He is employed as a  Control and instrumentation engineer.  She has been followed at [facility].  She denies  any alcohol, drug, or tobacco use during this pregnancy.   PRIMARY CARE PHYSICIAN:  Darius Bump, M.D. at [facility].   PHYSICAL EXAMINATION:  VITAL SIGNS:  Blood pressure 158/90 on two  assessments.  Other vital signs are stable.  Weight is 241 pounds. Pre-  pregnancy weight was 240.  HEENT:  Within normal limits.  LUNGS:  Lung sounds are clear.  HEART:  Regular rate and rhythm without murmur.  BREASTS:  Soft and nontender.  ABDOMEN:  Fundal height is approximately 15 to 16 weeks.  The abdomen is  soft and nontender.  Fetal heart rate is in the 150s by Doppler.  EXTREMITIES:  Deep tendon reflexes are 2+ without clonus.  There is trace  edema noted.   LABORATORY DATA:  Dextrostix today was 349 mg/dl with 4+ glucosuria.  Urine  sample shows a trace of protein, no ketones, 2+ leukocytes, and trace blood.  This urine has been sent for culture.   ASSESSMENT:  1.  Intrauterine pregnancy at 15-3/7 weeks.  2.  Hypertension.  3.  Probable diabetes.   PLAN:  1.  Admit to [facility] for consult with Dr. Jaymes Graff as attending physician.  2.  10 units of regular insulin to  be given upon the patient's admission.  3.  Aldomet 250 mg one p.o. daily.  4.  200 calorie ADA diet or analogous diet per hospital guidelines.  5.  Fasting blood sugars and two-hour postprandials to be done.  6.  Tylenol 325 mg one to two p.o. q.3-4h. p.r.n. pain.  7.  Ambien 10 mg one p.o. q.h.s. p.r.n.  8.  Dr. Normand Sloop will write further orders after the patient arrives at the      hospital.     Dorise Hiss   VLL/MEDQ  D:  03/03/2004  T:  03/03/2004  Job:  [id number]

## 2010-08-27 NOTE — Discharge Summary (Signed)
NAME:  Patricia Estrada, Patricia Estrada          ACCOUNT NO.:  [id number]   MEDICAL RECORD NO.:  [id number]          PATIENT TYPE:  INP   LOCATION:  9303                          FACILITY:  [facility]   PHYSICIAN:  Naima A. Dillard, M.D. DATE OF BIRTH:  1975/06/16   DATE OF ADMISSION:  03/03/2004  DATE OF DISCHARGE:  03/05/2004                                 DISCHARGE SUMMARY   ADMISSION DIAGNOSES:  1.  Intrauterine pregnancy at 15-3/7 weeks.  2.  Hypertension.  3.  Insulin-dependent diabetes.   DISCHARGE DIAGNOSES:  1.  Intrauterine pregnancy at 15-3/7 weeks.  2.  Hypertension.  3.  Insulin-dependent diabetes.   PROCEDURES:  1.  Capillary blood glucose monitoring.  2.  Diabetic teaching.  3.  Diabetic control with insulin.   HOSPITAL COURSE:  The patient was admitted for evaluation and management of  hypertension and diabetes which was newly diagnosed.  She was seen in the  office and found to have a blood sugar of 364 after orange juice three hours  prior.  She has a strong family history of diabetes.  Her blood pressure was  also elevated in the office at 158/90.  She was therefore admitted for  evaluation and management of diabetes and hypertension.   She was started on 10 units of insulin q.h.s. and Aldomet 250 mg, one p.o.  daily.  She was placed on an ADA diet.  Blood sugar monitoring was done.  On  her second hospital day, blood pressures were normalizing to 128-132/79.  Fasting blood sugar was 133.  Two-hour PC's were 172, 97, and 116.  A 24-  hour urine was begun for evaluation of hypertension and renal function.  CBC  showed a hemoglobin of 11.9, platelets 239.  CMET was normal, with a  creatinine of 0.7.  Hemoglobin A1C was 6.2.  On hospital day #3, she  continued to improve.  Blood pressures were 120-132/63-79.  Fasting blood  sugar was 100.  Her two-hour PC's were 95 and 106.  A 24-hour urine showed  creatinine clearance of 182 and a total protein of 143.  In light of her  improved condition, she was deemed to have received full benefit of her  hospital stay and was discharged to home after diabetic teaching was  completed.   DISCHARGE MEDICATIONS:  1.  NPH insulin, 12 units q.h.s.  2.  Prenatal vitamins.  3.  Aldomet 250 mg one p.o. daily.   LABORATORY DATA ON DISCHARGE:  Fasting blood sugar 100, total protein 143.  White blood cell count 9.4, hemoglobin 11.9, platelets 239.  Chemistries  were within normal limits.   DISCHARGE INSTRUCTIONS:  1.  Diabetic teaching which was done here in the hospital prior to discharge      for diet, blood sugar testing, and      insulin administration.  She will also continue to be monitored in the      office and insulin adjusted accordingly.  2.  She will have her blood pressure monitored as time goes on to assess      adequacy of medication.   FOLLOW UP:  The patient is  to follow up in one to two weeks at [facility] or  p.r.n.     Mari   MLW/MEDQ  D:  03/05/2004  T:  03/05/2004  Job:  [id number]

## 2011-06-23 ENCOUNTER — Ambulatory Visit (INDEPENDENT_AMBULATORY_CARE_PROVIDER_SITE_OTHER): Payer: 59 | Admitting: Obstetrics and Gynecology

## 2011-06-23 DIAGNOSIS — Z113 Encounter for screening for infections with a predominantly sexual mode of transmission: Secondary | ICD-10-CM

## 2011-06-23 DIAGNOSIS — N760 Acute vaginitis: Secondary | ICD-10-CM

## 2011-06-23 DIAGNOSIS — Z01419 Encounter for gynecological examination (general) (routine) without abnormal findings: Secondary | ICD-10-CM

## 2011-08-18 ENCOUNTER — Encounter: Payer: Self-pay | Admitting: Obstetrics and Gynecology

## 2011-08-18 DIAGNOSIS — I10 Essential (primary) hypertension: Secondary | ICD-10-CM

## 2011-08-18 DIAGNOSIS — E1159 Type 2 diabetes mellitus with other circulatory complications: Secondary | ICD-10-CM

## 2011-08-18 DIAGNOSIS — O24419 Gestational diabetes mellitus in pregnancy, unspecified control: Secondary | ICD-10-CM

## 2011-08-22 ENCOUNTER — Ambulatory Visit (INDEPENDENT_AMBULATORY_CARE_PROVIDER_SITE_OTHER): Payer: 59 | Admitting: Obstetrics and Gynecology

## 2011-08-22 ENCOUNTER — Encounter: Payer: Self-pay | Admitting: Obstetrics and Gynecology

## 2011-08-22 VITALS — BP 144/80 | Temp 99.3°F | Ht 69.0 in | Wt 240.0 lb

## 2011-08-22 DIAGNOSIS — O99810 Abnormal glucose complicating pregnancy: Secondary | ICD-10-CM

## 2011-08-22 DIAGNOSIS — I9589 Other hypotension: Secondary | ICD-10-CM

## 2011-08-22 DIAGNOSIS — Z309 Encounter for contraceptive management, unspecified: Secondary | ICD-10-CM

## 2011-08-22 DIAGNOSIS — O24419 Gestational diabetes mellitus in pregnancy, unspecified control: Secondary | ICD-10-CM

## 2011-08-22 DIAGNOSIS — Z30433 Encounter for removal and reinsertion of intrauterine contraceptive device: Secondary | ICD-10-CM

## 2011-08-22 DIAGNOSIS — IMO0001 Reserved for inherently not codable concepts without codable children: Secondary | ICD-10-CM

## 2011-08-22 MED ORDER — LEVONORGESTREL 20 MCG/24HR IU IUD
INTRAUTERINE_SYSTEM | Freq: Once | INTRAUTERINE | Status: AC
  Administered 2011-08-22: 16:00:00 via INTRAUTERINE

## 2011-08-22 MED ORDER — LEVONORGESTREL 20 MCG/24HR IU IUD: INTRAUTERINE_SYSTEM | Freq: Once | INTRAUTERINE | Status: DC

## 2011-08-22 NOTE — Progress Notes (Signed)
IUD INSERTION NOTE  Patricia Estrada is a 36 y.o. female G1P1001 who presents for IUD removal and re insertion.  Consent signed after risks and benefits were reviewed including but not limited to bleeding, infection, expulsion and risk of uterine perforation that may require an additional procedure for removal.  LMP: No LMP recorded. Patient is not currently having periods (Reason: IUD).  UPT: negative    MIRENA LOT NUMBER: [id number]   Prepped with Betadine ; Mirena Removed without difficulty Prepped once again then tenaculum placed on on anterior lip of cervix after Hurricane gel was applied Uterus sounded at  8 cm-retroverted Insertion of MIRENA IUD per protocol without any complications   Assessment:  IUD Removal/Reinsertion  Plan:  1. Patient instructed to call with oral temperature of 100.4 degrees Fahrenheit or more, excessive bleeding or pain that is not relieved with OTC analgesia taken as directed  2. Patient instructed on how  to check IUD strings and encouraged to do so after each menstrual cycle  3. Advised not to place anything in vagina or have sexual intercourse for 7 days  4. Follow-up: 4 weeks   Seraphine Gudiel PA-C 08/22/2011 4:42 PM

## 2011-08-22 NOTE — Patient Instructions (Signed)
Schedule follow up in 4 [facility] [phone]:  -for temperature of 100.4 degrees Fahrenheit or more -pain not improved with over the counter pain medications (Ibuprofen, Advil, Aleve,        Tylenol or acetaminophen) -for excessive bleeding (more than a usual period) -for any other concerns  Do not place anything in your vagina for the next 7 days

## 2011-09-19 ENCOUNTER — Encounter: Payer: 59 | Admitting: Obstetrics and Gynecology

## 2014-02-10 ENCOUNTER — Encounter: Payer: Self-pay | Admitting: Obstetrics and Gynecology

## 2016-02-22 DIAGNOSIS — H02012 Cicatricial entropion of right lower eyelid: Secondary | ICD-10-CM | POA: Diagnosis not present

## 2016-02-22 DIAGNOSIS — Q103 Other congenital malformations of eyelid: Secondary | ICD-10-CM | POA: Diagnosis not present

## 2016-03-16 DIAGNOSIS — Z6833 Body mass index (BMI) 33.0-33.9, adult: Secondary | ICD-10-CM | POA: Diagnosis not present

## 2016-03-16 DIAGNOSIS — E119 Type 2 diabetes mellitus without complications: Secondary | ICD-10-CM | POA: Diagnosis not present

## 2016-03-16 DIAGNOSIS — R631 Polydipsia: Secondary | ICD-10-CM | POA: Diagnosis not present

## 2016-03-16 DIAGNOSIS — Z0000 Encounter for general adult medical examination without abnormal findings: Secondary | ICD-10-CM | POA: Diagnosis not present

## 2016-03-16 DIAGNOSIS — E669 Obesity, unspecified: Secondary | ICD-10-CM | POA: Diagnosis not present

## 2016-03-16 MED FILL — LISINOPRIL 10 MG TABLET: 10 | 90 days supply | Qty: 90 | Fill #0

## 2016-03-16 MED FILL — metFORMIN HCL 500 MG TABS: 500 | 30 days supply | Qty: 30 | Fill #0

## 2016-03-29 DIAGNOSIS — H02012 Cicatricial entropion of right lower eyelid: Secondary | ICD-10-CM | POA: Diagnosis not present

## 2016-04-18 MED FILL — metFORMIN HCL 500 MG TABS: 500 | 30 days supply | Qty: 30 | Fill #1

## 2016-06-01 DIAGNOSIS — H02012 Cicatricial entropion of right lower eyelid: Secondary | ICD-10-CM | POA: Diagnosis not present

## 2016-06-09 DIAGNOSIS — H52223 Regular astigmatism, bilateral: Secondary | ICD-10-CM | POA: Diagnosis not present

## 2016-06-09 DIAGNOSIS — H5213 Myopia, bilateral: Secondary | ICD-10-CM | POA: Diagnosis not present

## 2016-09-27 ENCOUNTER — Encounter: Payer: Self-pay | Admitting: Physician Assistant

## 2016-09-27 ENCOUNTER — Ambulatory Visit (INDEPENDENT_AMBULATORY_CARE_PROVIDER_SITE_OTHER): Payer: 59 | Admitting: Physician Assistant

## 2016-09-27 VITALS — BP 136/88 | HR 91 | Temp 98.3°F | Resp 18 | Ht 69.84 in | Wt 228.2 lb

## 2016-09-27 DIAGNOSIS — R21 Rash and other nonspecific skin eruption: Secondary | ICD-10-CM | POA: Diagnosis not present

## 2016-09-27 DIAGNOSIS — L03114 Cellulitis of left upper limb: Secondary | ICD-10-CM

## 2016-09-27 DIAGNOSIS — L299 Pruritus, unspecified: Secondary | ICD-10-CM | POA: Diagnosis not present

## 2016-09-27 MED ORDER — HYDROXYZINE HCL 25 MG PO TABS: 25.0000 mg | ORAL_TABLET | Freq: Every day | ORAL | 0 refills | Status: DC | PRN

## 2016-09-27 MED ORDER — CLINDAMYCIN HCL 300 MG PO CAPS: 300.0000 mg | ORAL_CAPSULE | Freq: Four times a day (QID) | ORAL | 0 refills | Status: DC

## 2016-09-27 MED FILL — CLINDAMYCIN HCL 300 MG CAP: 300 | 7 days supply | Qty: 28 | Fill #0

## 2016-09-27 NOTE — Patient Instructions (Addendum)
Take antibiotic as prescribed. Take the capsule with a full glass of water to keep it from irritating your throat. Clindamycin can cause diarrhea, which may be severe or lead to serious, life-threatening intestinal problems. If you have diarrhea that is watery or bloody, stop using this medicine and call your doctor. Your symptoms should be improving in 24-48 hours with this medication. If anything gets worse, or you develop pain, shortness of breath, numbness, tingling, loss of sensation, or chest pain, seek care immediately at the ED. Otherwise, return in 48 hours to our clinic for reevaluation to ensure proper healing. I have also given you a prescription for hydroxyzine to use for itching.    Cellulitis, Adult Cellulitis is a skin infection. The infected area is usually red and sore. This condition occurs most often in the arms and lower legs. It is very important to get treated for this condition. Follow these instructions at home:  Take over-the-counter and prescription medicines only as told by your doctor.  If you were prescribed an antibiotic medicine, take it as told by your doctor. Do not stop taking the antibiotic even if you start to feel better.  Drink enough fluid to keep your pee (urine) clear or pale yellow.  Do not touch or rub the infected area.  Raise (elevate) the infected area above the level of your heart while you are sitting or lying down.  Place warm or cold wet cloths (warm or cold compresses) on the infected area. Do this as told by your doctor.  Keep all follow-up visits as told by your doctor. This is important. These visits let your doctor make sure your infection is not getting worse. Contact a doctor if:  You have a fever.  Your symptoms do not get better after 1-2 days of treatment.  Your bone or joint under the infected area starts to hurt after the skin has healed.  Your infection comes back. This can happen in the same area or another area.  You have  a swollen bump in the infected area.  You have new symptoms.  You feel ill and also have muscle aches and pains. Get help right away if:  Your symptoms get worse.  You feel very sleepy.  You throw up (vomit) or have watery poop (diarrhea) for a long time.  There are red streaks coming from the infected area.  Your red area gets larger.  Your red area turns darker. This information is not intended to replace advice given to you by your health care provider. Make sure you discuss any questions you have with your health care provider. Document Released: 09/14/2007 Document Revised: 09/03/2015 Document Reviewed: 02/04/2015 Elsevier Interactive Patient Education  2018 Elsevier Inc.   Clindamycin capsules What is this medicine? CLINDAMYCIN (KLIN da MYE sin) is a lincosamide antibiotic. It is used to treat certain kinds of bacterial infections. It will not work for colds, flu, or other viral infections. This medicine may be used for other purposes; ask your health care provider or pharmacist if you have questions. COMMON BRAND NAME(S): Cleocin What should I tell my health care provider before I take this medicine? They need to know if you have any of these conditions: -kidney disease -liver disease -stomach problems like colitis -an unusual or allergic reaction to clindamycin, lincomycin, or other medicines, foods, dyes like tartrazine or preservatives -pregnant or trying to get pregnant -breast-feeding How should I use this medicine? Take this medicine by mouth with a full glass of water. Follow  the directions on the prescription label. You can take this medicine with food or on an empty stomach. If the medicine upsets your stomach, take it with food. Take your medicine at regular intervals. Do not take your medicine more often than directed. Take all of your medicine as directed even if you think your are better. Do not skip doses or stop your medicine early. Talk to your  pediatrician regarding the use of this medicine in children. Special care may be needed. Overdosage: If you think you have taken too much of this medicine contact a poison control center or emergency room at once. NOTE: This medicine is only for you. Do not share this medicine with others. What if I miss a dose? If you miss a dose, take it as soon as you can. If it is almost time for your next dose, take only that dose. Do not take double or extra doses. What may interact with this medicine? -birth control pills -erythromycin -medicines that relax muscles for surgery -rifampin This list may not describe all possible interactions. Give your health care provider a list of all the medicines, herbs, non-prescription drugs, or dietary supplements you use. Also tell them if you smoke, drink alcohol, or use illegal drugs. Some items may interact with your medicine. What should I watch for while using this medicine? Tell your doctor or healthcare professional if your symptoms do not start to get better or if they get worse. Do not treat diarrhea with over the counter products. Contact your doctor if you have diarrhea that lasts more than 2 days or if it is severe and watery. What side effects may I notice from receiving this medicine? Side effects that you should report to your doctor or health care professional as soon as possible: -allergic reactions like skin rash, itching or hives, swelling of the face, lips, or tongue -dark urine -pain on swallowing -redness, blistering, peeling or loosening of the skin, including inside the mouth -unusual bleeding or bruising -unusually weak or tired -yellowing of eyes or skin Side effects that usually do not require medical attention (report to your doctor or health care professional if they continue or are bothersome): -diarrhea -itching in the rectal or genital area -joint pain -nausea, vomiting -stomach pain This list may not describe all possible side  effects. Call your doctor for medical advice about side effects. You may report side effects to FDA at 1-800-FDA-1088. Where should I keep my medicine? Keep out of the reach of children. Store at room temperature between 20 and 25 degrees C (68 and 77 degrees F). Throw away any unused medicine after the expiration date. NOTE: This sheet is a summary. It may not cover all possible information. If you have questions about this medicine, talk to your doctor, pharmacist, or health care provider.  2018 Elsevier/Gold Standard (2015-07-01 16:34:00)  IF you received an x-ray today, you will receive an invoice from [facility]. Please contact [facility] at [phone] with questions or concerns regarding your invoice.   IF you received labwork today, you will receive an invoice from [facility]. Please contact [facility] at [phone] with questions or concerns regarding your invoice.   Our billing staff will not be able to assist you with questions regarding bills from these companies.  You will be contacted with the lab results as soon as they are available. The fastest way to get your results is to activate your My Chart account. Instructions are located on the last page of this paperwork.  If you have not heard from [place] regarding the results in 2 weeks, please contact this office.

## 2016-09-27 NOTE — Progress Notes (Signed)
Patricia Estrada Sardinas  MRN: [id number] DOB: [birth year]  Subjective:  Patricia Estrada is a 41 y.o. female seen in office today for a chief complaint of suspected insect bite on left upper arm, which occurred 3 days ago while sleeping. Woke up the next morning and was having a lot of itching on her left upper forearm. She had associated swelling, redness, and warmth. Has mild discomfort, rates it a 2/10.   Has tried ibuprofen and hydrocortisone with moderate relief. Notes her symptoms are much improved since they appeared 3 days ago. . Patient denies decreased sensation, numbness, tingling, fever, pallor, myalgia, chest pain, SOB, palpitations, nausea, vomiting, and diaphoresis. Denies smoking. Has mirena IUD in place. Has no personal hx of blood clots or cancer. Denies recent travel, immobilization, surgery, or strenuous exercise.    Review of Systems  Constitutional: Negative for chills and fatigue.  Eyes: Negative for visual disturbance.  Respiratory: Negative for cough.   Cardiovascular: Negative for leg swelling.  Gastrointestinal: Negative for abdominal pain.  Genitourinary: Negative for decreased urine volume.  Neurological: Negative for dizziness, light-headedness and headaches.    Patient Active Problem List   Diagnosis Date Noted  . Chronic hypotension   . Gestational diabetes   . Gestational diabetes mellitus 08/18/2011  . High blood pressure 08/18/2011    Current Outpatient Prescriptions on File Prior to Visit  Medication Sig Dispense Refill  . lisinopril (PRINIVIL,ZESTRIL) 10 MG tablet Take 10 mg by mouth daily.     Current Facility-Administered Medications on File Prior to Visit  Medication Dose Route Frequency Provider Last Rate Last Dose  . levonorgestrel (MIRENA) 20 MCG/24HR IUD   Intrauterine Once Henreitta LeberPowell, [place], PA-C        Allergies  Allergen Reactions  . Penicillins       Social History   Social History  . Marital status: Married    Spouse  name: N/A  . Number of children: N/A  . Years of education: N/A   Occupational History  . Not on file.   Social History Main Topics  . Smoking status: Never Smoker  . Smokeless tobacco: Never Used  . Alcohol use No  . Drug use: No  . Sexual activity: Not Currently    Birth control/ protection: IUD     Comment: mirena   Other Topics Concern  . Not on file   Social History Narrative  . No narrative on file    Objective:  BP 136/88 (BP Location: Right Arm, Patient Position: Sitting, Cuff Size: Large)   Pulse 91   Temp 98.3 F (36.8 C) (Oral)   Resp 18   Ht 5' 9.84" (1.774 m)   Wt 228 lb 3.2 oz (103.5 kg)   SpO2 100%   BMI 32.89 kg/m   Physical Exam  Constitutional: She is oriented to person, place, and time and well-developed, well-nourished, and in no distress.  HENT:  Head: Normocephalic and atraumatic.  Eyes: Conjunctivae are normal.  Neck: Normal range of motion.  Cardiovascular: Normal rate, regular rhythm and normal heart sounds.   Pulses:      Radial pulses are 2+ on the right side, and 2+ on the left side.  Capillary refill <2 seconds in bilateral hands.   Pulmonary/Chest: Effort normal and breath sounds normal. She has no wheezes. She has no rhonchi. She has no rales.  Musculoskeletal:       Right lower leg: She exhibits no swelling.       Left lower leg: She exhibits  no swelling.  Right upper forearm measures 34 cm  Left upper forearm measures 37 cm   Neurological: She is alert and oriented to person, place, and time. Gait normal.  Skin: Skin is warm and dry. No cyanosis.     Psychiatric: Affect normal.  Vitals reviewed.        Surgical marker used to outline induration, erythema, and warmth.   Assessment and Plan :  1. Cellulitis of left upper extremity PE findings consistent with cellulitis. Pt is allergic to PCNs. Will treat with clindamycin. Educated on side effects of clindamycin. Informed pt she should start seeing improvement in the  next 24-48 hours. Plan for follow up in 2 days for reevaluation. Instructed that if symptoms worsen, or she develops pain, numbness, tingling, loss of sensation, SOB or chest pain to seek care immediately at the ED. Pt understands and agrees to tx plan.  - CBC with Differential/Platelet - clindamycin (CLEOCIN) 300 MG capsule; Take 1 capsule (300 mg total) by mouth 4 (four) times daily.  Dispense: 28 capsule; Refill: 0 2. Itching  - hydrOXYzine (ATARAX/VISTARIL) 25 MG tablet; Take 1 tablet (25 mg total) by mouth daily as needed for itching (Take 0.5-1 pill at bedtime prn for itching). Take 0.5-1 pill at bedtime prn for itching  Dispense: 30 tablet; Refill: 0  Benjiman Core, PA-C  Primary Care at [facility] 09/27/2016 3:39 PM

## 2016-09-28 LAB — CBC WITH DIFFERENTIAL/PLATELET
BASOS: 0 %
Basophils Absolute: 0 10*3/uL (ref 0.0–0.2)
EOS (ABSOLUTE): 0.1 10*3/uL (ref 0.0–0.4)
EOS: 2 %
HEMATOCRIT: 42.6 % (ref 34.0–46.6)
Hemoglobin: 14 g/dL (ref 11.1–15.9)
IMMATURE GRANS (ABS): 0 10*3/uL (ref 0.0–0.1)
IMMATURE GRANULOCYTES: 0 %
LYMPHS: 26 %
Lymphocytes Absolute: 1.8 10*3/uL (ref 0.7–3.1)
MCH: 31 pg (ref 26.6–33.0)
MCHC: 32.9 g/dL (ref 31.5–35.7)
MCV: 94 fL (ref 79–97)
Monocytes Absolute: 0.4 10*3/uL (ref 0.1–0.9)
Monocytes: 5 %
NEUTROS PCT: 67 %
Neutrophils Absolute: 4.8 10*3/uL (ref 1.4–7.0)
PLATELETS: 226 10*3/uL (ref 150–379)
RBC: 4.52 x10E6/uL (ref 3.77–5.28)
RDW: 12.3 % (ref 12.3–15.4)
WBC: 7.2 10*3/uL (ref 3.4–10.8)

## 2016-09-29 ENCOUNTER — Encounter: Payer: Self-pay | Admitting: Family Medicine

## 2016-09-29 ENCOUNTER — Ambulatory Visit (INDEPENDENT_AMBULATORY_CARE_PROVIDER_SITE_OTHER): Payer: 59 | Admitting: Family Medicine

## 2016-09-29 VITALS — BP 147/88 | HR 98 | Temp 98.6°F | Resp 18 | Ht 69.84 in | Wt 226.8 lb

## 2016-09-29 DIAGNOSIS — L0390 Cellulitis, unspecified: Secondary | ICD-10-CM | POA: Diagnosis not present

## 2016-09-29 NOTE — Patient Instructions (Addendum)
  Everything looks great!  Keep taking the clindamycin as prescribed.   Like we discussed, if you start having any redness streaking up the arm, fevers and chills, nausea or vomiting come back and see us. I do not think any this will happen and you will just continue to get better.  It was good to meet you today. I'm glad you're doing well!   IF you received an x-ray today, you will receive an invoice from [facility]. Please contact [facility] at [phone] with questions or concerns regarding your invoice.   IF you received labwork today, you will receive an invoice from [facility]. Please contact [facility] at [phone] with questions or concerns regarding your invoice.   Our billing staff will not be able to assist you with questions regarding bills from these companies.  You will be contacted with the lab results as soon as they are available. The fastest way to get your results is to activate your My Chart account. Instructions are located on the last page of this paperwork. If you have not heard from us regarding the results in 2 weeks, please contact this office.

## 2016-09-29 NOTE — Progress Notes (Signed)
   Patricia Estrada is a 41 y.o. female who presents [facility] today for FU for cellulitis:    1.  Arm cellulitis:  Patient leaves that she was eaten by a bug over the weekend. She noted some increasing swelling in her left upper extremity just distal to the axilla which started. She presented here on Tuesday which was 2 days ago. Diagnosed with cellulitis and started on clindamycin. States that she has been greatly increasing since then. Swelling is always completely gone. Erythema is completely gone. She's had no evidence of lymphangitis. No fevers or chills. No nausea or vomiting. She is eating and drinking well.  ROS as above.    PMH reviewed. Patient is a nonsmoker.   Past Medical History:  Diagnosis Date  . Chronic hypotension   . Hypertension    Past Surgical History:  Procedure Laterality Date  . EYE SURGERY    . WISDOM TOOTH EXTRACTION      Medications reviewed. Current Outpatient Prescriptions  Medication Sig Dispense Refill  . clindamycin (CLEOCIN) 300 MG capsule Take 1 capsule (300 mg total) by mouth 4 (four) times daily. 28 capsule 0  . hydrOXYzine (ATARAX/VISTARIL) 25 MG tablet Take 1 tablet (25 mg total) by mouth daily as needed for itching (Take 0.5-1 pill at bedtime prn for itching). Take 0.5-1 pill at bedtime prn for itching 30 tablet 0  . lisinopril (PRINIVIL,ZESTRIL) 10 MG tablet Take 10 mg by mouth daily.     Current Facility-Administered Medications  Medication Dose Route Frequency Provider Last Rate Last Dose  . levonorgestrel (MIRENA) 20 MCG/24HR IUD   Intrauterine Once [place] GuitarPowell, [place], [place]         Physical Exam:  BP (!) 147/88   Pulse 98   Temp 98.6 F (37 C) (Oral)   Resp 18   Ht 5' 9.84" (1.774 m)   Wt 226 lb 12.8 oz (102.9 kg)   SpO2 100%   BMI 32.69 kg/m  Gen:  Alert, cooperative patient who appears stated age in no acute distress.  Vital signs reviewed. HEENT: EOMI,  MMM Skin: 1 cm area of edema without overlying  erythema left upper extremity medial aspect about 3 cm distal from the axilla. This is nontender. She states that this was much more swollen and indurated 2 days ago. No red streaks/evidence of lymphangitis. No erythema whatsoever. Axilla: No LAD noted Left axilla  Assessment and Plan:  1.  Cellulitis: - resolving very nicely. - continue clinda for full course - she's had no diarrhea.  She was previously warned to watch for this. - Warning precautions provided.  FU if recurs.

## 2017-09-29 ENCOUNTER — Other Ambulatory Visit: Payer: Self-pay

## 2017-09-29 ENCOUNTER — Ambulatory Visit (INDEPENDENT_AMBULATORY_CARE_PROVIDER_SITE_OTHER): Payer: No Typology Code available for payment source | Admitting: Physician Assistant

## 2017-09-29 ENCOUNTER — Encounter: Payer: Self-pay | Admitting: Physician Assistant

## 2017-09-29 VITALS — BP 122/90 | HR 104 | Temp 98.7°F | Resp 16 | Ht 69.84 in | Wt 216.8 lb

## 2017-09-29 DIAGNOSIS — E119 Type 2 diabetes mellitus without complications: Secondary | ICD-10-CM | POA: Insufficient documentation

## 2017-09-29 DIAGNOSIS — I10 Essential (primary) hypertension: Secondary | ICD-10-CM

## 2017-09-29 DIAGNOSIS — L309 Dermatitis, unspecified: Secondary | ICD-10-CM

## 2017-09-29 DIAGNOSIS — E669 Obesity, unspecified: Secondary | ICD-10-CM | POA: Insufficient documentation

## 2017-09-29 DIAGNOSIS — Z23 Encounter for immunization: Secondary | ICD-10-CM | POA: Diagnosis not present

## 2017-09-29 DIAGNOSIS — Z6831 Body mass index (BMI) 31.0-31.9, adult: Secondary | ICD-10-CM | POA: Diagnosis not present

## 2017-09-29 LAB — POCT URINALYSIS DIP (MANUAL ENTRY)
BILIRUBIN UA: NEGATIVE
BILIRUBIN UA: NEGATIVE mg/dL
Blood, UA: NEGATIVE
Glucose, UA: >=1000 mg/dL — AB
LEUKOCYTES UA: NEGATIVE
Nitrite, UA: NEGATIVE
Protein Ur, POC: NEGATIVE mg/dL
Spec Grav, UA: 1.015 (ref 1.010–1.025)
Urobilinogen, UA: 0.2 E.U./dL
pH, UA: 6 (ref 5.0–8.0)

## 2017-09-29 LAB — POCT GLYCOSYLATED HEMOGLOBIN (HGB A1C): Hemoglobin A1C: 13.8 % — AB (ref 4.0–5.6)

## 2017-09-29 LAB — GLUCOSE, POCT (MANUAL RESULT ENTRY): POC Glucose: 345 mg/dl — AB (ref 70–99)

## 2017-09-29 MED ORDER — METFORMIN HCL 1000 MG PO TABS: 1000.0000 mg | ORAL_TABLET | Freq: Two times a day (BID) | ORAL | 0 refills | Status: DC

## 2017-09-29 MED ORDER — TRIAMCINOLONE ACETONIDE 0.5 % EX CREA: 1.0000 "application " | TOPICAL_CREAM | Freq: Three times a day (TID) | CUTANEOUS | 0 refills | Status: DC

## 2017-09-29 MED ORDER — DAPAGLIFLOZIN PROPANEDIOL 5 MG PO TABS: 5.0000 mg | ORAL_TABLET | Freq: Every day | ORAL | 0 refills | Status: DC

## 2017-09-29 MED FILL — TRIAMCINOLONE 0.5% CREAM: 0.5 | 10 days supply | Qty: 30 | Fill #0

## 2017-09-29 MED FILL — metFORMIN HCL 1000 MG TABS: 1000 | 90 days supply | Qty: 180 | Fill #0

## 2017-09-29 MED FILL — FARXIGA 5 MG TABLET: 5 | 90 days supply | Qty: 90 | Fill #0

## 2017-09-29 NOTE — Patient Instructions (Addendum)
For diabetes, you are going to be started on two medications. One is metformin and you will taper it up over the next few weeks to avoid GI symptoms. Days 1-4,  you will take 500mg  (1/2 tablet) in the morning with food. Days 5-8, you will take 500mg  in the morning and 500mg  in the evening with food. Days 9-12, you will take 1000mg  in the morning and 500mg  in the evening with food. Days 13-thereon, you will take 1000mg  in the morning and 1000mg  in the evening with food. At your next visit, we will prescribe 1000mg  tablets and you will take one in the morning and one in the evening with food. Common side effects of metformin include GI upset like nausea and diarrhea, which is why it is important to take with food.   You will also start farxiga. This works by helping you "pee out sugar." Take one 5 mg tablet every morning with food.  Adverse side effects can be UTIs, back pain, nausea, and low blood pressure.  Start checking your blood sugars daily with your glucometer. Check your sugars when you are fastin gin the morning before a meal. Your goal is 70-100. If you drop below 70 over the next two weeks, let us know. Signs of hypoglycemia are lightheadedness, blurred vision, headache, and confusion.   I have placed referral for diabetic nutrition.  In the meantime, try to limit the amount of carbs and juice you intake.  Work on [employer].  Drinks mostly water.  You should hear from the diabetic nutritionist in about 2 weeks.  I also placed a referral for ophthalmology to have a diabetic eye exam.  They should contact you within the next 2 weeks.  Follow up in 2 weeks for reevaluation.     Diabetes is a very complicated disease...lets simplify it.   An easy way to look at it to understand the complications is if you think of the extra sugar floating in your blood stream as glass shards floating through your blood stream.   Diabetes affects your small vessels first: 1) The glass shards (sugar)  scrapes down the tiny blood vessels in your eyes and lead to diabetic retinopathy, the leading cause of blindness in the [place]. Diabetes is the leading cause of newly diagnosed adult (31 to 41 years of age) blindness in the [place].  2) The glass shards scratches down the tiny vessels of your legs leading to nerve damage called neuropathy and can lead to amputations of your feet. More than 60% of all non-traumatic amputations of lower limbs occur in people with diabetes.  3) Over time the small vessels in your brain are shredded and closed off, individually this does not cause any problems but over a long period of time many of the small vessels being blocked can lead to Vascular Dementia.   4) Your kidney's are a filter system and have a "net" that keeps certain things in the body and lets bad things out. Sugar shreds this net and leads to kidney damage and eventually failure. Decreasing the sugar that is destroying the net and certain blood pressure medications can help stop or decrease progression of kidney disease. Diabetes was the primary cause of kidney failure in 44 percent of all new cases in 2011.  5) Diabetes also destroys the small vessels in your penis that lead to erectile dysfunction. Eventually the vessels are so damaged that you may not be responsive to cialis or viagra.   Diabetes and your  large vessels: Your larger vessels consist of your coronary arteries in your heart and the carotid vessels to your brain. Diabetes or even increased sugars put you at 300% increased risk of heart attack and stroke and this is why.. The sugar scrapes down your large blood vessels and your body sees this as an internal injury and tries to repair itself. Just like you get a scab on your skin, your platelets will stick to the blood vessel wall trying to heal it. This is why we have diabetics on low dose aspirin daily, this prevents the platelets from sticking and can prevent plaque formation. In  addition, your body takes cholesterol and tries to shove it into the open wound. This is why we want your LDL, or bad cholesterol, below 70.   The combination of platelets and cholesterol over 5-10 years forms plaque that can break off and cause a heart attack or stroke.   PLEASE REMEMBER:   Diabetes is preventable! Up to 85 percent of complications and morbidities among individuals with type 2 diabetes can be prevented, delayed, or effectively treated and minimized with regular visits to a health professional, appropriate monitoring and medication, and a healthy diet and lifestyle.  Here is some information to help you keep your heart healthy: Move it! - Aim for 30 mins of activity every day. Take it slowly at first. Talk to [place] before starting any new exercise program.   Lose it.  -Body Mass Index (BMI) can indicate if you need to lose weight. A healthy range is 18.5-24.9. For a BMI calculator, go to [employer]  Waist Management -Excess abdominal fat is a risk factor for heart disease, diabetes, asthma, stroke and more. Ideal waist circumference is less than 35" for women and less than 40" for men.   Eat Right -focus on fruits, vegetables, whole grains, and meals you make yourself. Avoid foods with trans fat and high sugar/sodium content.   Snooze or Snore? - Loud snoring can be a sign of sleep apnea, a significant risk factor for high blood pressure, heart attach, stroke, and heart arrhythmias.  Kick the habit -Quit Smoking! Avoid second hand smoke. A single cigarette raises your blood pressure for 20 mins and increases the risk of heart attack and stroke for the next 24 hours.   Are Aspirin and Supplements right for you? -Add ENTERIC COATED low dose 81 mg Aspirin daily OR can do every other day if you have easy bruising to protect your heart and head. As well as to reduce risk of Colon Cancer by 20 %, Skin Cancer by 26 % , Melanoma by 46% and Pancreatic cancer by 60%  Say "No to  Stress -There may be little you can do about problems that cause stress. However, techniques such as long walks, meditation, and exercise can help you manage it.   Start Now! - Make changes one at a time and set reasonable goals to increase your likelihood of success.       Type 2 Diabetes Mellitus, Diagnosis, Adult Type 2 diabetes (type 2 diabetes mellitus) is a long-term (chronic) disease. It may be caused by one or both of these problems:  Your body does not make enough of a hormone called insulin.  Your body does not react in a normal way to insulin that it makes.  Insulin lets sugars (glucose) go into cells in the body. This gives you energy. If you have type 2 diabetes, sugars cannot get into cells. This causes high blood sugar (  hyperglycemia). Your doctor will set treatment goals for you. Generally, you should have these blood sugar levels:  Before meals (preprandial): 80-130 mg/dL (5.6-2.14.4-7.2 mmol/L).  After meals (postprandial): below 180 mg/dL (10 mmol/L).  A1c (hemoglobin A1c) level: less than 7%.  Follow these instructions at home: Questions to Ask Your Doctor  You may want to ask these questions:  Do I need to meet with a diabetes educator?  Where can I find a support group for people with diabetes?  What equipment will I need to care for myself at home?  What diabetes medicines do I need? When should I take them?  How often do I need to check my blood sugar?  What number can I call if I have questions?  When is my next doctor's visit?  General instructions  Take over-the-counter and prescription medicines only as told by your doctor.  Keep all follow-up visits as told by your doctor. This is important. Contact a doctor if:  Your blood sugar is at or above 240 mg/dL (30.813.3 mmol/L) for 2 days in a row.  You have been sick or have had a fever for 2 days or more and you are not getting better.  You have any of these problems for more than 6 hours: ? You  cannot eat or drink. ? You feel sick to your stomach (nauseous). ? You throw up (vomit). ? You have watery poop (diarrhea). Get help right away if:  Your blood sugar is lower than 54 mg/dL (3 mmol/L).  You get confused.  You have trouble: ? Thinking clearly. ? Breathing.  You have moderate or large ketone levels in your pee (urine). This information is not intended to replace advice given to you by your health care provider. Make sure you discuss any questions you have with your health care provider. Document Released: 01/05/2008 Document Revised: 09/03/2015 Document Reviewed: 05/01/2015 Elsevier Interactive Patient Education  2018 [employer]  General measures for eczema - In clinical experience, avoidance of irritants or exacerbating factors is beneficial for most patients with dyshidrotic eczema. General skin care measures aimed at reducing skin irritation and restoring the skin barrier include:  Marland Kitchen. Using lukewarm water and soap-free cleansers to wash hands  . Drying hands thoroughly after washing  . Applying emollients (eg, petroleum jelly) immediately after hand drying and as often as possible  . Wearing cotton gloves under vinyl or other nonlatex gloves when performing wet work  . Removing rings and watches and bracelets before wet work  . Wearing protective gloves in cold weather  . Wearing task-specific gloves for frictional exposures (eg, gardening, carpentry)  . Avoiding exposure to irritants (eg, detergents, solvents, hair lotions or dyes, acidic foods [eg, citrus fruit])  In clinical practice, astringent solutions such as aluminum subacetate (Burow's solution) or witch hazel are used for wet, weeping skin. Hands or feet are soaked in the solution for 15 minutes two to four times per day.  For flare ups, apply the triamcinolone cream to affected areas until those rashes are completely gone and then switch over to good hypoallergenic thick moisturizer cream such as Eucerin,  Cedaphil, or Aquaphor and apply this continually twice a day - especially immediately after showering to prevent it from coming back.    IF you received an x-ray today, you will receive an invoice from [facility]. Please contact [facility] at [phone] with questions or concerns regarding your invoice.   IF you received labwork today, you will receive an invoice from [facility].  Please contact [facility] at [phone] with questions or concerns regarding your invoice.   Our billing staff will not be able to assist you with questions regarding bills from these companies.  You will be contacted with the lab results as soon as they are available. The fastest way to get your results is to activate your My Chart account. Instructions are located on the last page of this paperwork. If you have not heard from [place] regarding the results in 2 weeks, please contact this office.

## 2017-09-29 NOTE — Progress Notes (Signed)
MRN: [id number]  Subjective:   Patricia Estrada is a 42 y.o. female who presents for follow up of Type 2 diabetes mellitus. Dx made in 2017. Had gestational diabetes in 2006. Last A1C in 2017 was 11.3. Started metformin at that time for ~60 days, lost weight, then stopped medication and never followed up. Her sx had resolved so she thought it was cured. For the past 3 months has been having polyuria. Patient is not checking home blood sugars. Patient denies increased appetite, nausea, paresthesia of the feet, polydipsia, visual disturbances, vomiting and weight loss. Patient is not checking their feet daily. No foot concerns. Last diabetic eye exam eye exam:never. Diet: permier protein drinks, grilled chicken, vegetables, tacos, pizza. Will do good for a couple of meals but then "blow it" for a couple of meals. Has cup of a juice every morning. Apple juice is her weakness. Blueberry muffin sometimes in the morning.  Exercise includes none. Denies smoking or alcohol use. Known diabetic complications: none  Immunizations: Flu vaccine: 2018, pneumococal vaccine: never  Other concerns: Takes lisninpril '10mg'$  daily for HTN. Does not check bp regularly. Does not need refills at this time.   Spot of eczema on left forearm on and off x months. Has assoicated itching, no pain. Uses clobetasol cream but is almost out.  Does not apply lotion frequently.  Did apply lotion today.  No new exposures.  ROS per HPI  Patient Active Problem List   Diagnosis Date Noted  . Type 2 diabetes mellitus without complication, without long-term current use of insulin ([place]) 09/29/2017  . Class 1 obesity with serious comorbidity and body mass index (BMI) of 31.0 to 31.9 in adult 09/29/2017  . Chronic hypotension   . Gestational diabetes   . High blood pressure 08/18/2011   Past Medical History:  Diagnosis Date  . Eczema    Social History   Socioeconomic History  . Marital status: Married    Spouse name:  Not on file  . Number of children: Not on file  . Years of education: Not on file  . Highest education level: Not on file  Occupational History  . Not on file  Social Needs  . Financial resource strain: Not on file  . Food insecurity:    Worry: Not on file    Inability: Not on file  . Transportation needs:    Medical: Not on file    Non-medical: Not on file  Tobacco Use  . Smoking status: Never Smoker  . Smokeless tobacco: Never Used  Substance and Sexual Activity  . Alcohol use: No  . Drug use: No  . Sexual activity: Not Currently    Birth control/protection: IUD    Comment: mirena  Lifestyle  . Physical activity:    Days per week: Not on file    Minutes per session: Not on file  . Stress: Not on file  Relationships  . Social connections:    Talks on phone: Not on file    Gets together: Not on file    Attends religious service: Not on file    Active member of club or organization: Not on file    Attends meetings of clubs or organizations: Not on file    Relationship status: Not on file  . Intimate partner violence:    Fear of current or ex partner: Not on file    Emotionally abused: Not on file    Physically abused: Not on file    Forced sexual  activity: Not on file  Other Topics Concern  . Not on file  Social History Narrative  . Not on file      Objective:   PHYSICAL EXAM BP 122/90   Pulse (!) 104   Temp 98.7 F (37.1 C)   Resp 16   Ht 5' 9.84" (1.774 m)   Wt 216 lb 12.8 oz (98.3 kg)   SpO2 98%   BMI 31.25 kg/m   Physical Exam  Constitutional: She is oriented to person, place, and time. She appears well-developed and well-nourished. No distress.  HENT:  Head: Normocephalic and atraumatic.  Mouth/Throat: Uvula is midline, oropharynx is clear and moist and mucous membranes are normal.  Eyes: Pupils are equal, round, and reactive to light. Conjunctivae and EOM are normal.  Neck: Normal range of motion.  Cardiovascular: Normal rate, regular rhythm,  normal heart sounds and intact distal pulses.  Pulmonary/Chest: Effort normal and breath sounds normal. She has no wheezes. She has no rhonchi. She has no rales.  Musculoskeletal:       Right lower leg: She exhibits no swelling.       Left lower leg: She exhibits no swelling.  Neurological: She is alert and oriented to person, place, and time.  Skin: Skin is warm and dry. Rash (thickened patch on left forearm) noted.  Psychiatric: She has a normal mood and affect.  Vitals reviewed.   Diabetic Foot Exam - Simple   Simple Foot Form Visual Inspection No deformities, no ulcerations, no other skin breakdown bilaterally:  Yes Sensation Testing Intact to touch and monofilament testing bilaterally:  Yes Pulse Check Posterior Tibialis and Dorsalis pulse intact bilaterally:  Yes Comments     Results for orders placed or performed in visit on 09/29/17 (from the past 24 hour(s))  POCT glucose (manual entry)     Status: Abnormal   Collection Time: 09/29/17  8:47 AM  Result Value Ref Range   POC Glucose 345 (A) 70 - 99 mg/dl  POCT urinalysis dipstick     Status: Abnormal   Collection Time: 09/29/17  8:52 AM  Result Value Ref Range   Color, UA yellow yellow   Clarity, UA clear clear   Glucose, UA >=1,000 (A) negative mg/dL   Bilirubin, UA negative negative   Ketones, POC UA negative negative mg/dL   Spec Grav, UA 1.015 1.010 - 1.025   Blood, UA negative negative   pH, UA 6.0 5.0 - 8.0   Protein Ur, POC negative negative mg/dL   Urobilinogen, UA 0.2 0.2 or 1.0 E.U./dL   Nitrite, UA Negative Negative   Leukocytes, UA Negative Negative  POCT glycosylated hemoglobin (Hb A1C)     Status: Abnormal   Collection Time: 09/29/17  8:52 AM  Result Value Ref Range   Hemoglobin A1C 13.8 (A) 4.0 - 5.6 %   HbA1c POC (<> result, manual entry)  4.0 - 5.6 %   HbA1c, POC (prediabetic range)  5.7 - 6.4 %   HbA1c, POC (controlled diabetic range)  0.0 - 7.0 %    Assessment and Plan :  1. Type 2  diabetes mellitus without complication, without long-term current use of insulin ([facility]) Uncontrolled.  Point-of-care glucose is 354.  A1c 13.8.   +glucosuria.  No ketones in urine.  Only symptom is polyuria x3 months.  She is overall well appearing, NAD. Recommended dual agent therapy at this time. Taper of metformin up.  Start [place].  Further labs pending.  Will need to start statin but will  wait for lipid panel to determine strength of statin. Discussed diabetes diagnosis in great detail. Went over mechanism and how it affects both small and large vessels in the body.  Discussed potential complications of uncontrolled diabetes. Encouraged lifestyle modifications with changes in diet and exercise.  Encouraged patient to start checking sugars daily. Updated pt on vaccines. Plan to follow-up in 2 weeks for reevaluation. - CMP14+EGFR - CBC with Differential/Platelet - Lipid panel - TSH - POCT urinalysis dipstick - POCT glucose (manual entry) - POCT glycosylated hemoglobin (Hb A1C) - Microalbumin/Creatinine Ratio, Urine - Ambulatory referral to diabetic education - Ambulatory referral to Ophthalmology - HM Diabetes Foot Exam - Pneumococcal polysaccharide vaccine 23-valent greater than or equal to 2yo subcutaneous/IM - Td vaccine greater than or equal to 7yo preservative free IM - metFORMIN (GLUCOPHAGE) 1000 MG tablet; Take 1 tablet (1,000 mg total) by mouth 2 (two) times daily with a meal.  Dispense: 180 tablet; Refill: 0 - dapagliflozin propanediol (FARXIGA) 5 MG TABS tablet; Take 5 mg by mouth daily.  Dispense: 90 tablet; Refill: 0  2. Essential hypertension Controlled at this time.  Continue with current medication regimen.  3. Class 1 obesity with serious comorbidity and body mass index (BMI) of 31.0 to 31.9 in adult, unspecified obesity type Discussed weight loss with patient.  Specifically, discussed low carb, high protein diet.  Patient is encouraged to lose weight as this is what helped  her in the past.  Given referral to diabetic nutritionist as well.  4. Eczema, unspecified type Educated on general measures for care of eczema.  Encouraged to apply emollient at least twice a day daily.  May use triamcinolone cream for acute flareups. - triamcinolone cream (KENALOG) 0.5 %; Apply 1 application topically 3 (three) times daily.  Dispense: 30 g; Refill: 0  A total of 40 minutes was spent in the room with the patient, greater than 50% of which was in counseling/coordination of care regarding T2DM.   Side effects, risks, benefits, and alternatives of the medications and treatment plan prescribed today were discussed, and patient expressed understanding of the instructions given. No barriers to understanding were identified. Red flags discussed in detail. Pt expressed understanding regarding what to do in case of emergency/urgent symptoms.  Tenna Delaine, PA-C  [facility] 09/29/2017 9:45 AM

## 2017-09-30 LAB — LIPID PANEL
CHOLESTEROL TOTAL: 216 mg/dL — AB (ref 100–199)
Chol/HDL Ratio: 4.8 ratio — ABNORMAL HIGH (ref 0.0–4.4)
HDL: 45 mg/dL (ref >39)
LDL Calculated: 149 mg/dL — ABNORMAL HIGH (ref 0–99)
Triglycerides: 111 mg/dL (ref 0–149)
VLDL CHOLESTEROL CAL: 22 mg/dL (ref 5–40)

## 2017-09-30 LAB — CMP14+EGFR
A/G RATIO: 2.4 — AB (ref 1.2–2.2)
ALT: 13 IU/L (ref 0–32)
AST: 9 IU/L (ref 0–40)
Albumin: 4.6 g/dL (ref 3.5–5.5)
Alkaline Phosphatase: 103 IU/L (ref 39–117)
BUN/Creatinine Ratio: 11 (ref 9–23)
BUN: 9 mg/dL (ref 6–24)
Bilirubin Total: 0.4 mg/dL (ref 0.0–1.2)
CALCIUM: 10.1 mg/dL (ref 8.7–10.2)
CO2: 22 mmol/L (ref 20–29)
CREATININE: 0.83 mg/dL (ref 0.57–1.00)
Chloride: 99 mmol/L (ref 96–106)
GFR, EST AFRICAN AMERICAN: 101 mL/min/{1.73_m2} (ref >59)
GFR, EST NON AFRICAN AMERICAN: 88 mL/min/{1.73_m2} (ref >59)
GLUCOSE: 348 mg/dL — AB (ref 65–99)
Globulin, Total: 1.9 g/dL (ref 1.5–4.5)
POTASSIUM: 4.3 mmol/L (ref 3.5–5.2)
Sodium: 136 mmol/L (ref 134–144)
TOTAL PROTEIN: 6.5 g/dL (ref 6.0–8.5)

## 2017-09-30 LAB — CBC WITH DIFFERENTIAL/PLATELET
BASOS: 0 %
Basophils Absolute: 0 10*3/uL (ref 0.0–0.2)
EOS (ABSOLUTE): 0.1 10*3/uL (ref 0.0–0.4)
Eos: 1 %
Hematocrit: 41.4 % (ref 34.0–46.6)
Hemoglobin: 13.6 g/dL (ref 11.1–15.9)
IMMATURE GRANS (ABS): 0 10*3/uL (ref 0.0–0.1)
IMMATURE GRANULOCYTES: 0 %
LYMPHS: 31 %
Lymphocytes Absolute: 2.2 10*3/uL (ref 0.7–3.1)
MCH: 30.4 pg (ref 26.6–33.0)
MCHC: 32.9 g/dL (ref 31.5–35.7)
MCV: 93 fL (ref 79–97)
MONOCYTES: 5 %
Monocytes Absolute: 0.4 10*3/uL (ref 0.1–0.9)
Neutrophils Absolute: 4.6 10*3/uL (ref 1.4–7.0)
Neutrophils: 63 %
PLATELETS: 226 10*3/uL (ref 150–450)
RBC: 4.47 x10E6/uL (ref 3.77–5.28)
RDW: 12.7 % (ref 12.3–15.4)
WBC: 7.3 10*3/uL (ref 3.4–10.8)

## 2017-09-30 LAB — TSH: TSH: 3.3 u[IU]/mL (ref 0.450–4.500)

## 2017-09-30 LAB — MICROALBUMIN / CREATININE URINE RATIO
CREATININE, UR: 120.4 mg/dL
MICROALB/CREAT RATIO: 11.9 mg/g{creat} (ref 0.0–30.0)
Microalbumin, Urine: 14.3 ug/mL

## 2017-10-04 ENCOUNTER — Other Ambulatory Visit: Payer: Self-pay | Admitting: Physician Assistant

## 2017-10-04 MED ORDER — ATORVASTATIN CALCIUM 20 MG PO TABS: 20.0000 mg | ORAL_TABLET | Freq: Every day | ORAL | 3 refills | Status: DC

## 2017-10-04 NOTE — Progress Notes (Signed)
Meds ordered this encounter  Medications  . atorvastatin (LIPITOR) 20 MG tablet    Sig: Take 1 tablet (20 mg total) by mouth daily.    Dispense:  90 tablet    Refill:  3    Order Specific Question:   Supervising Provider    Answer:   SMITH, KRISTI M [[id number]]    

## 2017-10-16 NOTE — Progress Notes (Signed)
MRN: [id number]  Subjective:   Patricia Estrada is a 42 y.o. female who presents for follow up of Type 2 diabetes mellitus. Last seen by me in office on 09/29/17 for polyuria. Had not been on metformin in ~ 2 years. A1C was 13.8. Started on metformin and farxiga. Today, notes she has changed her diet tremendously since last time. Has cut out extra juice and sugars. Eating more fruit and veggies. Admits good compliance, on metformin 1000mg  BID and farxiga 5mg  daily. Has had 2 episodes of diarrhea if she takes medication without food, it is tolerable.  Denies adverse effects including metallic taste, hypoglycemia, nausea, vomiting.   Patient is checking home blood sugars. Home blood sugar records: BGs range between 100 and 130.  Did go on vacation to [place] and notes her sugars went up to 200s when she drank daquiris. Current symptoms include none. Patient denies foot ulcerations, increased appetite, nausea, paresthesia of the feet, polydipsia, polyuria, visual disturbances, vomiting and weight loss. Has appt with dietician on 7/11. Has not heard from opthamology referral yet. Has exercised once since last visit. Denies smoking.   Other concerns: Has not picked up lipitor from pharmacy yet.    Patient Active Problem List   Diagnosis Date Noted  . Essential hypertension 10/18/2017  . Type 2 diabetes mellitus without complication, without long-term current use of insulin (HCC) 09/29/2017  . Class 1 obesity with serious comorbidity and body mass index (BMI) of 31.0 to 31.9 in adult 09/29/2017   Past Medical History:  Diagnosis Date  . Eczema   . Essential hypertension   . Gestational diabetes   . Type 2 diabetes mellitus (HCC)    Social History   Socioeconomic History  . Marital status: Married    Spouse name: Not on file  . Number of children: Not on file  . Years of education: Not on file  . Highest education level: Not on file  Occupational History  . Not on  file  Social Needs  . Financial resource strain: Not on file  . Food insecurity:    Worry: Not on file    Inability: Not on file  . Transportation needs:    Medical: Not on file    Non-medical: Not on file  Tobacco Use  . Smoking status: Never Smoker  . Smokeless tobacco: Never Used  Substance and Sexual Activity  . Alcohol use: No  . Drug use: No  . Sexual activity: Not Currently    Birth control/protection: IUD    Comment: mirena  Lifestyle  . Physical activity:    Days per week: Not on file    Minutes per session: Not on file  . Stress: Not on file  Relationships  . Social connections:    Talks on phone: Not on file    Gets together: Not on file    Attends religious service: Not on file    Active member of club or organization: Not on file    Attends meetings of clubs or organizations: Not on file    Relationship status: Not on file  . Intimate partner violence:    Fear of current or ex partner: Not on file    Emotionally abused: Not on file    Physically abused: Not on file    Forced sexual activity: Not on file  Other Topics Concern  . Not on file  Social History Narrative  . Not on file       Objective:  PHYSICAL EXAM BP 132/82   Pulse 100   Temp 98 F (36.7 C) (Oral)   Resp 16   Ht 5' 10.08" (1.78 m)   Wt 224 lb (101.6 kg)   SpO2 100%   BMI 32.07 kg/m   Physical Exam  Constitutional: She is oriented to person, place, and time. She appears well-developed and well-nourished.  HENT:  Head: Normocephalic and atraumatic.  Eyes: Conjunctivae are normal.  Neck: Normal range of motion.  Pulmonary/Chest: Effort normal.  Neurological: She is alert and oriented to person, place, and time.  Skin: Skin is warm and dry.  Psychiatric: She has a normal mood and affect.  Vitals reviewed.    Results for orders placed or performed in visit on 10/17/17 (from the past 24 hour(s))  POCT glucose (manual entry)     Status: None   Collection Time: 10/17/17   4:16 PM  Result Value Ref Range   POC Glucose 84 70 - 99 mg/dl    Wt Readings from Last 3 Encounters:  10/17/17 224 lb (101.6 kg)  09/29/17 216 lb 12.8 oz (98.3 kg)  09/29/16 226 lb 12.8 oz (102.9 kg)     Assessment and Plan :  1. Type 2 diabetes mellitus without complication, without long-term current use of insulin (HCC) Point-of-care glucose has significantly improved since last visit on 09/29/2017. 345->84.  Patient has been monitoring blood sugars appropriately and has tremendously changed her diet.  Taking medications as prescribed.  Congratulated patient on efforts.  Follow-up with dietitian as planned.  Continue lifestyle modifications.  FBS goal is 70-100.  Contact office if outside this range.  Encouraged to pick up statin and start taking today.  Otherwise, follow-up in about 3 months for CPE and diabetes follow-up. - POCT glucose (manual entry)  2. Essential hypertension Controlled today. Continue medication.   3. Class 1 obesity with serious comorbidity and body mass index (BMI) of 31.0 to 31.9 in adult, unspecified obesity type Patient has actually gained weight since last visit but did go on vacation and admits to not eating the best while on vacation.  She is interested in starting exercise.  Has follow with dietitian later this week.   Benjiman CoreBrittany Wiseman, PA-C  Primary Care at [facility] 10/18/2017 8:23 AM

## 2017-10-17 ENCOUNTER — Other Ambulatory Visit: Payer: Self-pay

## 2017-10-17 ENCOUNTER — Ambulatory Visit (INDEPENDENT_AMBULATORY_CARE_PROVIDER_SITE_OTHER): Payer: No Typology Code available for payment source | Admitting: Physician Assistant

## 2017-10-17 ENCOUNTER — Encounter: Payer: Self-pay | Admitting: Physician Assistant

## 2017-10-17 VITALS — BP 132/82 | HR 100 | Temp 98.0°F | Resp 16 | Ht 70.08 in | Wt 224.0 lb

## 2017-10-17 DIAGNOSIS — E669 Obesity, unspecified: Secondary | ICD-10-CM | POA: Diagnosis not present

## 2017-10-17 DIAGNOSIS — E119 Type 2 diabetes mellitus without complications: Secondary | ICD-10-CM

## 2017-10-17 DIAGNOSIS — I10 Essential (primary) hypertension: Secondary | ICD-10-CM

## 2017-10-17 DIAGNOSIS — Z6831 Body mass index (BMI) 31.0-31.9, adult: Secondary | ICD-10-CM

## 2017-10-17 LAB — GLUCOSE, POCT (MANUAL RESULT ENTRY): POC GLUCOSE: 84 mg/dL (ref 70–99)

## 2017-10-17 NOTE — Patient Instructions (Addendum)
You are doing so great! Keep up the hard work. Follow up with dietician as planned. Continuing limiting carbs and sweets. Pick up lipitor and take as prescribed. If you have not heard from eye doctor by next week, mychart message me. If you continue to have frequent bouts of diarrhea, please let me know so we can switch your metformin. Otherwise, keep doing what you are doing. Follow up in 3 months. Thank you for letting me participate in your health and well being.   IF you received an x-ray today, you will receive an invoice from [facility]. Please contact [facility] at [phone] with questions or concerns regarding your invoice.   IF you received labwork today, you will receive an invoice from [facility]. Please contact [facility] at [phone] with questions or concerns regarding your invoice.   Our billing staff will not be able to assist you with questions regarding bills from these companies.  You will be contacted with the lab results as soon as they are available. The fastest way to get your results is to activate your My Chart account. Instructions are located on the last page of this paperwork. If you have not heard from us regarding the results in 2 weeks, please contact this office.

## 2017-10-18 DIAGNOSIS — I10 Essential (primary) hypertension: Secondary | ICD-10-CM | POA: Insufficient documentation

## 2017-10-19 ENCOUNTER — Encounter: Payer: No Typology Code available for payment source | Attending: Physician Assistant

## 2017-10-19 DIAGNOSIS — E119 Type 2 diabetes mellitus without complications: Secondary | ICD-10-CM

## 2017-10-19 NOTE — Progress Notes (Signed)
Patient was seen on 10/19/17 for the first of a series of three diabetes self-management courses at the [facility].  Patient Education Plan per assessed needs and concerns is to attend three course education program for Diabetes Self Management Education.  The following learning objectives were met by the patient during this class:  Describe diabetes  State some common risk factors for diabetes  Defines the role of glucose and insulin  Identifies type of diabetes and pathophysiology  Describe the relationship between diabetes and cardiovascular risk  State the members of the Healthcare Team  States the rationale for glucose monitoring  State when to test glucose  State their individual Target Range  State the importance of logging glucose readings  Describe how to interpret glucose readings  Identifies A1C target  Explain the correlation between A1c and eAG values  State symptoms and treatment of high blood glucose  State symptoms and treatment of low blood glucose  Explain proper technique for glucose testing  Identifies proper sharps disposal  Handouts given during class include:  ADA Diabetes You Take Control   Carb Counting and Meal Planning book  Meal Plan Card  Meal planning worksheet  Low Sodium Flavoring Tips  Types of Fats  The diabetes portion plate  O0B to eAG Conversion Chart  Diabetes Recommended Care Schedule  Support Group  Diabetes Success Plan  Core Class Satisfaction Survey   Follow-Up Plan:  Attend core 2

## 2017-10-26 DIAGNOSIS — E119 Type 2 diabetes mellitus without complications: Secondary | ICD-10-CM

## 2017-10-26 NOTE — Progress Notes (Signed)
Patient was seen on 10/26/17 for the second of a series of three diabetes self-management courses at the [facility]. The following learning objectives were met by the patient during this class:   Describe the role of different macronutrients on glucose  Explain how carbohydrates affect blood glucose  State what foods contain the most carbohydrates  Demonstrate carbohydrate counting  Demonstrate how to read Nutrition Facts food label  Describe effects of various fats on heart health  Describe the importance of good nutrition for health and healthy eating strategies  Describe techniques for managing your shopping, cooking and meal planning  List strategies to follow meal plan when dining out  Describe the effects of alcohol on glucose and how to use it safely  Goals:  Follow Diabetes Meal Plan as instructed  Aim to spread carbs evenly throughout the day  Aim for 3 meals per day and snacks as needed Include lean protein foods to meals/snacks  Monitor glucose levels as instructed by your doctor   Follow-Up Plan:  Attend Core 3  Work towards following your personal food plan.   

## 2017-11-02 DIAGNOSIS — E119 Type 2 diabetes mellitus without complications: Secondary | ICD-10-CM

## 2017-11-02 NOTE — Progress Notes (Signed)
Patient was seen on 11/02/17 for the third of a series of three diabetes self-management courses at the [facility].   Patricia Estrada. State the amount of activity recommended for healthy living . Describe activities suitable for individual needs . Identify ways to regularly incorporate activity into daily life . Identify barriers to activity and ways to over come these barriers  Identify diabetes medications being personally used and their primary action for lowering glucose and possible side effects . Describe role of stress on blood glucose and develop strategies to address psychosocial issues . Identify diabetes complications and ways to prevent them  Explain how to manage diabetes during illness . Evaluate success in meeting personal goal . Establish 2-3 goals that they will plan to diligently work on  Goals:   I will count my carb choices at most meals and snacks  I will be active 60 minutes or more 3 times a week  Your patient has identified these potential barriers to change:  Motivation  Your patient has identified their diabetes self-care support plan as  Family Support On-line Resources    Plan:  Attend Support Group as desired

## 2017-11-11 ENCOUNTER — Encounter: Payer: Self-pay | Admitting: Physician Assistant

## 2017-11-12 ENCOUNTER — Other Ambulatory Visit: Payer: Self-pay

## 2017-11-12 MED ORDER — GLUCOSE BLOOD VI STRP: ORAL_STRIP | 12 refills | Status: AC

## 2017-12-20 ENCOUNTER — Encounter: Payer: No Typology Code available for payment source | Admitting: Physician Assistant

## 2018-01-16 ENCOUNTER — Encounter: Payer: No Typology Code available for payment source | Admitting: Physician Assistant

## 2018-10-09 ENCOUNTER — Other Ambulatory Visit: Payer: Self-pay | Admitting: Physician Assistant

## 2018-10-09 DIAGNOSIS — E119 Type 2 diabetes mellitus without complications: Secondary | ICD-10-CM

## 2018-10-09 DIAGNOSIS — L309 Dermatitis, unspecified: Secondary | ICD-10-CM

## 2018-10-23 ENCOUNTER — Ambulatory Visit (INDEPENDENT_AMBULATORY_CARE_PROVIDER_SITE_OTHER): Payer: No Typology Code available for payment source | Admitting: Registered Nurse

## 2018-10-23 ENCOUNTER — Encounter: Payer: Self-pay | Admitting: Registered Nurse

## 2018-10-23 ENCOUNTER — Other Ambulatory Visit: Payer: Self-pay

## 2018-10-23 VITALS — BP 160/90 | HR 111 | Temp 98.6°F | Resp 16 | Ht 69.69 in | Wt 226.0 lb

## 2018-10-23 DIAGNOSIS — E785 Hyperlipidemia, unspecified: Secondary | ICD-10-CM | POA: Diagnosis not present

## 2018-10-23 DIAGNOSIS — Z1329 Encounter for screening for other suspected endocrine disorder: Secondary | ICD-10-CM | POA: Diagnosis not present

## 2018-10-23 DIAGNOSIS — Z13220 Encounter for screening for lipoid disorders: Secondary | ICD-10-CM | POA: Diagnosis not present

## 2018-10-23 DIAGNOSIS — Z130 Encounter for screening for diseases of the blood and blood-forming organs and certain disorders involving the immune mechanism: Secondary | ICD-10-CM

## 2018-10-23 DIAGNOSIS — Z13228 Encounter for screening for other metabolic disorders: Secondary | ICD-10-CM | POA: Diagnosis not present

## 2018-10-23 DIAGNOSIS — E119 Type 2 diabetes mellitus without complications: Secondary | ICD-10-CM

## 2018-10-23 DIAGNOSIS — I10 Essential (primary) hypertension: Secondary | ICD-10-CM

## 2018-10-23 MED ORDER — ATORVASTATIN CALCIUM 20 MG PO TABS: 20.0000 mg | ORAL_TABLET | Freq: Every day | ORAL | 3 refills | Status: DC

## 2018-10-23 MED ORDER — FARXIGA 5 MG PO TABS: 5.0000 mg | ORAL_TABLET | Freq: Every day | ORAL | 1 refills | Status: DC

## 2018-10-23 MED ORDER — LISINOPRIL 10 MG PO TABS: 10.0000 mg | ORAL_TABLET | Freq: Every day | ORAL | 1 refills | Status: DC

## 2018-10-23 MED ORDER — METFORMIN HCL 1000 MG PO TABS: 1000.0000 mg | ORAL_TABLET | Freq: Two times a day (BID) | ORAL | 1 refills | Status: DC

## 2018-10-23 MED FILL — ATORVASTATIN 20 MG TABLET: 20 | 90 days supply | Qty: 90 | Fill #0

## 2018-10-23 MED FILL — LISINOPRIL 10 MG TABLET: 10 | 90 days supply | Qty: 90 | Fill #0

## 2018-10-23 MED FILL — metFORMIN HCL 1000 MG TABS: 1000 | 90 days supply | Qty: 180 | Fill #0

## 2018-10-23 MED FILL — FARXIGA 5 MG TABLET: 5 | 90 days supply | Qty: 90 | Fill #0

## 2018-10-23 NOTE — Progress Notes (Signed)
Established Patient Office Visit  Subjective:  Patient ID: Patricia Estrada, female    DOB: 04/02/1976  Age: 43 y.o. MRN: [id number]  CC:  Chief Complaint  Patient presents with  . Transfer of Care    need new pcp to manage CC and medications  . Medication Refill    all medications     HPI Patricia Estrada presents for transfer of care and med refills  T2DM: Takes metformin and farxiga. Last A1c 1 year ago: 13.8%. Will recheck today, maintain current dose for time being.  HTN: Taking Lisinopril 10mg  PO qd with good effect, has been out for around 3 months. Will restart today and recheck BP in around 2 weeks  HLD: Taking atorvastatin 20mg  PO qd without adverse effect. Will check lipids today.  Also uses Mirena for [facility]. Notes she is due for CPE, Mirena removal and reinsert and pap. We discussed that these would be two additional appointments, but both that we can handle in our office.  Otherwise, she feels healthy. She has some stress as her and her 78 year old son have been stuck at home during [place] for around 3-4 months.  Past Medical History:  Diagnosis Date  . Eczema   . Essential hypertension   . Gestational diabetes   . Type 2 diabetes mellitus ([place])     Past Surgical History:  Procedure Laterality Date  . EYE SURGERY    . WISDOM TOOTH EXTRACTION      Family History  Problem Relation Age of Onset  . Diabetes Mother   . Cancer Mother     Social History   Socioeconomic History  . Marital status: Married    Spouse name: Not on file  . Number of children: 1  . Years of education: Not on file  . Highest education level: Not on file  Occupational History  . Not on file  Social Needs  . Financial resource strain: Not hard at all  . Food insecurity    Worry: Never true    Inability: Never true  . Transportation needs    Medical: No    Non-medical: No  Tobacco Use  . Smoking status: Never Smoker  . Smokeless tobacco: Never Used   Substance and Sexual Activity  . Alcohol use: No  . Drug use: No  . Sexual activity: Not Currently    Birth control/protection: I.U.D.    Comment: mirena  Lifestyle  . Physical activity    Days per week: 0 days    Minutes per session: 0 min  . Stress: To some extent  Relationships  . Social Herbalist on phone: Three times a week    Gets together: Twice a week    Attends religious service: Patient refused    Active member of club or organization: Patient refused    Attends meetings of clubs or organizations: Patient refused    Relationship status: Separated  . Intimate partner violence    Fear of current or ex partner: No    Emotionally abused: No    Physically abused: No    Forced sexual activity: No  Other Topics Concern  . Not on file  Social History Narrative  . Not on file    Outpatient Medications Prior to Visit  Medication Sig Dispense Refill  . glucose blood test strip Use as instructed 100 each 12  . levonorgestrel (MIRENA, 52 MG,) 20 MCG/24HR IUD by Intrauterine route.    . triamcinolone cream (KENALOG)  0.5 % Apply 1 application topically 3 (three) times daily. 30 g 0  . atorvastatin (LIPITOR) 20 MG tablet Take 1 tablet (20 mg total) by mouth daily. 90 tablet 3  . dapagliflozin propanediol (FARXIGA) 5 MG TABS tablet Take 5 mg by mouth daily. 90 tablet 0  . lisinopril (PRINIVIL,ZESTRIL) 10 MG tablet Take by mouth.    . metFORMIN (GLUCOPHAGE) 1000 MG tablet Take 1 tablet (1,000 mg total) by mouth 2 (two) times daily with a meal. 180 tablet 0   No facility-administered medications prior to visit.     Allergies  Allergen Reactions  . Penicillins     ROS Review of Systems  Constitutional: Negative.   HENT: Negative.   Eyes: Negative.   Respiratory: Negative.   Cardiovascular: Negative.   Gastrointestinal: Negative.   Endocrine: Negative.   Genitourinary: Negative.   Musculoskeletal: Negative.   Skin: Negative.   Allergic/Immunologic:  Negative.   Neurological: Negative.   Hematological: Negative.   Psychiatric/Behavioral: Negative.   All other systems reviewed and are negative.     Objective:    Physical Exam  Constitutional: She is oriented to person, place, and time. She appears well-developed and well-nourished.  Cardiovascular: Normal rate and regular rhythm.  Pulmonary/Chest: Effort normal. No respiratory distress.  Neurological: She is alert and oriented to person, place, and time.  Skin: Skin is warm and dry. No rash noted. No erythema. No pallor.  Psychiatric: She has a normal mood and affect. Her behavior is normal. Judgment and thought content normal.  Nursing note and vitals reviewed.   BP (!) 160/90   Pulse (!) 111   Temp 98.6 F (37 C) (Other (Comment))   Resp 16   Ht 5' 9.69" (1.77 m)   Wt 226 lb (102.5 kg)   SpO2 98%   BMI 32.72 kg/m  Wt Readings from Last 3 Encounters:  10/23/18 226 lb (102.5 kg)  10/17/17 224 lb (101.6 kg)  09/29/17 216 lb 12.8 oz (98.3 kg)     Health Maintenance Due  Topic Date Due  . OPHTHALMOLOGY EXAM  05/03/1986  . HIV Screening  05/04/1991  . PAP SMEAR-Modifier  05/03/1997  . HEMOGLOBIN A1C  03/31/2018  . FOOT EXAM  09/30/2018    There are no preventive care reminders to display for this patient.  Lab Results  Component Value Date   TSH 3.300 09/29/2017   Lab Results  Component Value Date   WBC 7.3 09/29/2017   HGB 13.6 09/29/2017   HCT 41.4 09/29/2017   MCV 93 09/29/2017   PLT 226 09/29/2017   Lab Results  Component Value Date   NA 136 09/29/2017   K 4.3 09/29/2017   CO2 22 09/29/2017   GLUCOSE 348 (H) 09/29/2017   BUN 9 09/29/2017   CREATININE 0.83 09/29/2017   BILITOT 0.4 09/29/2017   ALKPHOS 103 09/29/2017   AST 9 09/29/2017   ALT 13 09/29/2017   PROT 6.5 09/29/2017   ALBUMIN 4.6 09/29/2017   CALCIUM 10.1 09/29/2017   Lab Results  Component Value Date   CHOL 216 (H) 09/29/2017   Lab Results  Component Value Date   HDL 45  09/29/2017   Lab Results  Component Value Date   LDLCALC 149 (H) 09/29/2017   Lab Results  Component Value Date   TRIG 111 09/29/2017   Lab Results  Component Value Date   CHOLHDL 4.8 (H) 09/29/2017   Lab Results  Component Value Date   HGBA1C 13.8 (A) 09/29/2017  Assessment & Plan:   Problem List Items Addressed This Visit      Cardiovascular and Mediastinum   Essential hypertension   Relevant Medications   atorvastatin (LIPITOR) 20 MG tablet   lisinopril (ZESTRIL) 10 MG tablet     Endocrine   Type 2 diabetes mellitus without complication, without long-term current use of insulin (HCC)   Relevant Medications   atorvastatin (LIPITOR) 20 MG tablet   dapagliflozin propanediol (FARXIGA) 5 MG TABS tablet   lisinopril (ZESTRIL) 10 MG tablet   metFORMIN (GLUCOPHAGE) 1000 MG tablet    Other Visit Diagnoses    Screening for endocrine, metabolic and immunity disorder    -  Primary   Relevant Orders   CBC with Differential/Platelet   Comprehensive metabolic panel   Hemoglobin A1c   TSH   Lipid screening       Relevant Orders   Lipid panel   Hyperlipidemia, unspecified hyperlipidemia type       Relevant Medications   atorvastatin (LIPITOR) 20 MG tablet   lisinopril (ZESTRIL) 10 MG tablet      Meds ordered this encounter  Medications  . atorvastatin (LIPITOR) 20 MG tablet    Sig: Take 1 tablet (20 mg total) by mouth daily.    Dispense:  90 tablet    Refill:  3    Order Specific Question:   Supervising Provider    Answer:   Collie SiadSTALLINGS, ZOE A [id number]]  . dapagliflozin propanediol (FARXIGA) 5 MG TABS tablet    Sig: Take 5 mg by mouth daily.    Dispense:  90 tablet    Refill:  1    Order Specific Question:   Supervising Provider    Answer:   Collie SiadSTALLINGS, ZOE A [id number]]  . lisinopril (ZESTRIL) 10 MG tablet    Sig: Take 1 tablet (10 mg total) by mouth daily.    Dispense:  90 tablet    Refill:  1    Order Specific Question:   Supervising Provider    Answer:    Collie SiadSTALLINGS, ZOE A [id number]]  . metFORMIN (GLUCOPHAGE) 1000 MG tablet    Sig: Take 1 tablet (1,000 mg total) by mouth 2 (two) times daily with a meal.    Dispense:  180 tablet    Refill:  1    Order Specific Question:   Supervising Provider    Answer:   Doristine BosworthSTALLINGS, ZOE A [id number]]    Follow-up: No follow-ups on file.   PLAN  Refill meds  Return in 2-3 weeks for BP check and CPE  Return when available for Mirena removal and reinsert  Return in 6 months for med check and A1c check, sooner if labs warrant  Will follow up on labs  Patient encouraged to call clinic with any questions, comments, or concerns.     Janeece Ageeichard Gilverto Dileonardo, NP

## 2018-10-23 NOTE — Patient Instructions (Signed)
° ° ° °  If you have lab work done today you will be contacted with your lab results within the next 2 weeks.  If you have not heard from us then please contact us. The fastest way to get your results is to register for My Chart. ° ° °IF you received an x-ray today, you will receive an invoice from [facility]. Please contact  Radiology at [phone] with questions or concerns regarding your invoice.  ° °IF you received labwork today, you will receive an invoice from [facility]. Please contact [facility] at [phone] with questions or concerns regarding your invoice.  ° °Our billing staff will not be able to assist you with questions regarding bills from these companies. ° °You will be contacted with the lab results as soon as they are available. The fastest way to get your results is to activate your My Chart account. Instructions are located on the last page of this paperwork. If you have not heard from us regarding the results in 2 weeks, please contact this office. °  ° ° ° °

## 2018-10-24 LAB — COMPREHENSIVE METABOLIC PANEL
ALT: 20 IU/L (ref 0–32)
AST: 15 IU/L (ref 0–40)
Albumin/Globulin Ratio: 1.7 (ref 1.2–2.2)
Albumin: 4.5 g/dL (ref 3.8–4.8)
Alkaline Phosphatase: 97 IU/L (ref 39–117)
BUN/Creatinine Ratio: 16 (ref 9–23)
BUN: 12 mg/dL (ref 6–24)
Bilirubin Total: 0.3 mg/dL (ref 0.0–1.2)
CO2: 21 mmol/L (ref 20–29)
Calcium: 9.8 mg/dL (ref 8.7–10.2)
Chloride: 103 mmol/L (ref 96–106)
Creatinine, Ser: 0.75 mg/dL (ref 0.57–1.00)
GFR calc Af Amer: 114 mL/min/{1.73_m2} (ref >59)
GFR calc non Af Amer: 99 mL/min/{1.73_m2} (ref >59)
Globulin, Total: 2.6 g/dL (ref 1.5–4.5)
Glucose: 113 mg/dL — ABNORMAL HIGH (ref 65–99)
Potassium: 4.3 mmol/L (ref 3.5–5.2)
Sodium: 139 mmol/L (ref 134–144)
Total Protein: 7.1 g/dL (ref 6.0–8.5)

## 2018-10-24 LAB — CBC WITH DIFFERENTIAL/PLATELET
Basophils Absolute: 0.1 10*3/uL (ref 0.0–0.2)
Basos: 1 %
EOS (ABSOLUTE): 0.1 10*3/uL (ref 0.0–0.4)
Eos: 1 %
Hematocrit: 41.2 % (ref 34.0–46.6)
Hemoglobin: 13.7 g/dL (ref 11.1–15.9)
Immature Grans (Abs): 0 10*3/uL (ref 0.0–0.1)
Immature Granulocytes: 0 %
Lymphocytes Absolute: 2.5 10*3/uL (ref 0.7–3.1)
Lymphs: 31 %
MCH: 29.9 pg (ref 26.6–33.0)
MCHC: 33.3 g/dL (ref 31.5–35.7)
MCV: 90 fL (ref 79–97)
Monocytes Absolute: 0.4 10*3/uL (ref 0.1–0.9)
Monocytes: 5 %
Neutrophils Absolute: 4.9 10*3/uL (ref 1.4–7.0)
Neutrophils: 62 %
Platelets: 257 10*3/uL (ref 150–450)
RBC: 4.58 x10E6/uL (ref 3.77–5.28)
RDW: 11.7 % (ref 11.7–15.4)
WBC: 7.9 10*3/uL (ref 3.4–10.8)

## 2018-10-24 LAB — LIPID PANEL
Chol/HDL Ratio: 5.2 ratio — ABNORMAL HIGH (ref 0.0–4.4)
Cholesterol, Total: 220 mg/dL — ABNORMAL HIGH (ref 100–199)
HDL: 42 mg/dL (ref >39)
LDL Calculated: 144 mg/dL — ABNORMAL HIGH (ref 0–99)
Triglycerides: 171 mg/dL — ABNORMAL HIGH (ref 0–149)
VLDL Cholesterol Cal: 34 mg/dL (ref 5–40)

## 2018-10-24 LAB — HEMOGLOBIN A1C
Est. average glucose Bld gHb Est-mCnc: 137 mg/dL
Hgb A1c MFr Bld: 6.4 % — ABNORMAL HIGH (ref 4.8–5.6)

## 2018-10-24 LAB — TSH: TSH: 2.95 u[IU]/mL (ref 0.450–4.500)

## 2018-10-24 NOTE — Progress Notes (Signed)
Ms Patricia Estrada to speak with you on the phone and discuss these results. The improvement on your A1c is remarkable! Your lipids didn't respond quite as well to the atorvastatin, so I'd like for you to double your current dose to 40mg  per day. Taking two of the 20mg  tablets is fine. As you will run out sooner as a result, please send me a message on mychart or call our office and I can send over refills no problem. Thank you,  Kathrin Ruddy, NP

## 2018-11-13 ENCOUNTER — Ambulatory Visit (INDEPENDENT_AMBULATORY_CARE_PROVIDER_SITE_OTHER): Payer: No Typology Code available for payment source | Admitting: Registered Nurse

## 2018-11-13 ENCOUNTER — Other Ambulatory Visit (HOSPITAL_COMMUNITY)
Admission: RE | Admit: 2018-11-13 | Discharge: 2018-11-13 | Disposition: A | Discharge status: Home / Self Care | Payer: 59 | Source: Ambulatory Visit | Attending: Registered Nurse | Admitting: Registered Nurse

## 2018-11-13 ENCOUNTER — Encounter: Payer: Self-pay | Admitting: Registered Nurse

## 2018-11-13 ENCOUNTER — Other Ambulatory Visit: Payer: Self-pay

## 2018-11-13 VITALS — BP 122/70 | HR 102 | Temp 98.5°F | Resp 16 | Ht 69.88 in | Wt 227.0 lb

## 2018-11-13 DIAGNOSIS — Z0000 Encounter for general adult medical examination without abnormal findings: Secondary | ICD-10-CM

## 2018-11-13 DIAGNOSIS — Z01419 Encounter for gynecological examination (general) (routine) without abnormal findings: Secondary | ICD-10-CM | POA: Diagnosis not present

## 2018-11-13 NOTE — Patient Instructions (Addendum)
   If you have lab work done today you will be contacted with your lab results within the next 2 weeks.  If you have not heard from us then please contact us. The fastest way to get your results is to register for My Chart.   IF you received an x-ray today, you will receive an invoice from [facility]. Please contact [facility] at [phone] with questions or concerns regarding your invoice.   IF you received labwork today, you will receive an invoice from [facility]. Please contact [facility] at [phone] with questions or concerns regarding your invoice.   Our billing staff will not be able to assist you with questions regarding bills from these companies.  You will be contacted with the lab results as soon as they are available. The fastest way to get your results is to activate your My Chart account. Instructions are located on the last page of this paperwork. If you have not heard from us regarding the results in 2 weeks, please contact this office.       Health Maintenance, Female Adopting a healthy lifestyle and getting preventive care are important in promoting health and wellness. Ask your health care provider about:  The right schedule for you to have regular tests and exams.  Things you can do on your own to prevent diseases and keep yourself healthy. What should I know about diet, weight, and exercise? Eat a healthy diet   Eat a diet that includes plenty of vegetables, fruits, low-fat dairy products, and lean protein.  Do not eat a lot of foods that are high in solid fats, added sugars, or sodium. Maintain a healthy weight Body mass index (BMI) is used to identify weight problems. It estimates body fat based on height and weight. Your health care provider can help determine your BMI and help you achieve or maintain a healthy weight. Get regular exercise Get regular exercise. This is one of the most important things you can do for your health. Most  adults should:  Exercise for at least 150 minutes each week. The exercise should increase your heart rate and make you sweat (moderate-intensity exercise).  Do strengthening exercises at least twice a week. This is in addition to the moderate-intensity exercise.  Spend less time sitting. Even light physical activity can be beneficial. Watch cholesterol and blood lipids Have your blood tested for lipids and cholesterol at 42 years of age, then have this test every 5 years. Have your cholesterol levels checked more often if:  Your lipid or cholesterol levels are high.  You are older than 42 years of age.  You are at high risk for heart disease. What should I know about cancer screening? Depending on your health history and family history, you may need to have cancer screening at various ages. This may include screening for:  Breast cancer.  Cervical cancer.  Colorectal cancer.  Skin cancer.  Lung cancer. What should I know about heart disease, diabetes, and high blood pressure? Blood pressure and heart disease  High blood pressure causes heart disease and increases the risk of stroke. This is more likely to develop in people who have high blood pressure readings, are of African descent, or are overweight.  Have your blood pressure checked: ? Every 3-5 years if you are 18-39 years of age. ? Every year if you are 40 years old or older. Diabetes Have regular diabetes screenings. This checks your fasting blood sugar level. Have the screening done:  Once   every three years after age 40 if you are at a normal weight and have a low risk for diabetes.  More often and at a younger age if you are overweight or have a high risk for diabetes. What should I know about preventing infection? Hepatitis B If you have a higher risk for hepatitis B, you should be screened for this virus. Talk with your health care provider to find out if you are at risk for hepatitis B infection. Hepatitis  C Testing is recommended for:  Everyone born from 1945 through 1965.  Anyone with known risk factors for hepatitis C. Sexually transmitted infections (STIs)  Get screened for STIs, including gonorrhea and chlamydia, if: ? You are sexually active and are younger than 42 years of age. ? You are older than 42 years of age and your health care provider tells you that you are at risk for this type of infection. ? Your sexual activity has changed since you were last screened, and you are at increased risk for chlamydia or gonorrhea. Ask your health care provider if you are at risk.  Ask your health care provider about whether you are at high risk for HIV. Your health care provider may recommend a prescription medicine to help prevent HIV infection. If you choose to take medicine to prevent HIV, you should first get tested for HIV. You should then be tested every 3 months for as long as you are taking the medicine. Pregnancy  If you are about to stop having your period (premenopausal) and you may become pregnant, seek counseling before you get pregnant.  Take 400 to 800 micrograms (mcg) of folic acid every day if you become pregnant.  Ask for birth control (contraception) if you want to prevent pregnancy. Osteoporosis and menopause Osteoporosis is a disease in which the bones lose minerals and strength with aging. This can result in bone fractures. If you are 65 years old or older, or if you are at risk for osteoporosis and fractures, ask your health care provider if you should:  Be screened for bone loss.  Take a calcium or vitamin D supplement to lower your risk of fractures.  Be given hormone replacement therapy (HRT) to treat symptoms of menopause. Follow these instructions at home: Lifestyle  Do not use any products that contain nicotine or tobacco, such as cigarettes, e-cigarettes, and chewing tobacco. If you need help quitting, ask your health care provider.  Do not use street  drugs.  Do not share needles.  Ask your health care provider for help if you need support or information about quitting drugs. Alcohol use  Do not drink alcohol if: ? Your health care provider tells you not to drink. ? You are pregnant, may be pregnant, or are planning to become pregnant.  If you drink alcohol: ? Limit how much you use to 0-1 drink a day. ? Limit intake if you are breastfeeding.  Be aware of how much alcohol is in your drink. In the [place] one drink equals one 12 oz bottle of beer (355 mL), one 5 oz glass of wine (148 mL), or one 1 oz glass of hard liquor (44 mL). General instructions  Schedule regular health, dental, and eye exams.  Stay current with your vaccines.  Tell your health care provider if: ? You often feel depressed. ? You have ever been abused or do not feel safe at home. Summary  Adopting a healthy lifestyle and getting preventive care are important in promoting health and   wellness.  Follow your health care provider's instructions about healthy diet, exercising, and getting tested or screened for diseases.  Follow your health care provider's instructions on monitoring your cholesterol and blood pressure. This information is not intended to replace advice given to you by your health care provider. Make sure you discuss any questions you have with your health care provider. Document Released: 10/11/2010 Document Revised: 03/21/2018 Document Reviewed: 03/21/2018 Elsevier Patient Education  2020 [employer]     Why follow it? Research shows. . Those who follow the Mediterranean diet have a reduced risk of heart disease  . The diet is associated with a reduced incidence of Parkinson's and Alzheimer's diseases . People following the diet may have longer life expectancies and lower rates of chronic diseases  . The Dietary Guidelines for Americans recommends the Mediterranean diet as an eating plan to promote health and prevent disease  What Is the  Mediterranean Diet?  . Healthy eating plan based on typical foods and recipes of Mediterranean-style cooking . The diet is primarily a plant based diet; these foods should make up a majority of meals   Starches - Plant based foods should make up a majority of meals - They are an important sources of vitamins, minerals, energy, antioxidants, and fiber - Choose whole grains, foods high in fiber and minimally processed items  - Typical grain sources include wheat, oats, barley, corn, brown rice, bulgar, farro, millet, polenta, couscous  - Various types of beans include chickpeas, lentils, fava beans, black beans, white beans   Fruits  Veggies - Large quantities of antioxidant rich fruits & veggies; 6 or more servings  - Vegetables can be eaten raw or lightly drizzled with oil and cooked  - Vegetables common to the traditional Mediterranean Diet include: artichokes, arugula, beets, broccoli, brussel sprouts, cabbage, carrots, celery, collard greens, cucumbers, eggplant, kale, leeks, lemons, lettuce, mushrooms, okra, onions, peas, peppers, potatoes, pumpkin, radishes, rutabaga, shallots, spinach, sweet potatoes, turnips, zucchini - Fruits common to the Mediterranean Diet include: apples, apricots, avocados, cherries, clementines, dates, figs, grapefruits, grapes, melons, nectarines, oranges, peaches, pears, pomegranates, strawberries, tangerines  Fats - Replace butter and margarine with healthy oils, such as olive oil, canola oil, and tahini  - Limit nuts to no more than a handful a day  - Nuts include walnuts, almonds, pecans, pistachios, pine nuts  - Limit or avoid candied, honey roasted or heavily salted nuts - Olives are central to the Mediterranean diet - can be eaten whole or used in a variety of dishes   Meats Protein - Limiting red meat: no more than a few times a month - When eating red meat: choose lean cuts and keep the portion to the size of deck of cards - Eggs: approx. 0 to 4 times a  week  - Fish and lean poultry: at least 2 a week  - Healthy protein sources include, chicken, turkey, lean beef, lamb - Increase intake of seafood such as tuna, salmon, trout, mackerel, shrimp, scallops - Avoid or limit high fat processed meats such as sausage and bacon  Dairy - Include moderate amounts of low fat dairy products  - Focus on healthy dairy such as fat free yogurt, skim milk, low or reduced fat cheese - Limit dairy products higher in fat such as whole or 2% milk, cheese, ice cream  Alcohol - Moderate amounts of red wine is ok  - No more than 5 oz daily for women (all ages) and men older than age 65  -   No more than 10 oz of wine daily for men younger than 65  Other - Limit sweets and other desserts  - Use herbs and spices instead of salt to flavor foods  - Herbs and spices common to the traditional Mediterranean Diet include: basil, bay leaves, chives, cloves, cumin, fennel, garlic, lavender, marjoram, mint, oregano, parsley, pepper, rosemary, sage, savory, sumac, tarragon, thyme   It's not just a diet, it's a lifestyle:  . The Mediterranean diet includes lifestyle factors typical of those in the region  . Foods, drinks and meals are best eaten with others and savored . Daily physical activity is important for overall good health . This could be strenuous exercise like running and aerobics . This could also be more leisurely activities such as walking, housework, yard-work, or taking the stairs . Moderation is the key; a balanced and healthy diet accommodates most foods and drinks . Consider portion sizes and frequency of consumption of certain foods   Meal Ideas & Options:  . Breakfast:  o Whole wheat toast or whole wheat English muffins with peanut butter & hard boiled egg o Steel cut oats topped with apples & cinnamon and skim milk  o Fresh fruit: banana, strawberries, melon, berries, peaches  o Smoothies: strawberries, bananas, greek yogurt, peanut butter o Low fat  greek yogurt with blueberries and granola  o Egg white omelet with spinach and mushrooms o Breakfast couscous: whole wheat couscous, apricots, skim milk, cranberries  . Sandwiches:  o Hummus and grilled vegetables (peppers, zucchini, squash) on whole wheat bread   o Grilled chicken on whole wheat pita with lettuce, tomatoes, cucumbers or tzatziki  o Tuna salad on whole wheat bread: tuna salad made with greek yogurt, olives, red peppers, capers, green onions o Garlic rosemary lamb pita: lamb sauted with garlic, rosemary, salt & pepper; add lettuce, cucumber, greek yogurt to pita - flavor with lemon juice and black pepper  . Seafood:  o Mediterranean grilled salmon, seasoned with garlic, basil, parsley, lemon juice and black pepper o Shrimp, lemon, and spinach whole-grain pasta salad made with low fat greek yogurt  o Seared scallops with lemon orzo  o Seared tuna steaks seasoned salt, pepper, coriander topped with tomato mixture of olives, tomatoes, olive oil, minced garlic, parsley, green onions and cappers  . Meats:  o Herbed greek chicken salad with kalamata olives, cucumber, feta  o Red bell peppers stuffed with spinach, bulgur, lean ground beef (or lentils) & topped with feta   o Kebabs: skewers of chicken, tomatoes, onions, zucchini, squash  o Turkey burgers: made with red onions, mint, dill, lemon juice, feta cheese topped with roasted red peppers . Vegetarian o Cucumber salad: cucumbers, artichoke hearts, celery, red onion, feta cheese, tossed in olive oil & lemon juice  o Hummus and whole grain pita points with a greek salad (lettuce, tomato, feta, olives, cucumbers, red onion) o Lentil soup with celery, carrots made with vegetable broth, garlic, salt and pepper  o Tabouli salad: parsley, bulgur, mint, scallions, cucumbers, tomato, radishes, lemon juice, olive oil, salt and pepper.       Fat and Cholesterol Restricted Eating Plan Eating a diet that limits fat and cholesterol may  help lower your risk for heart disease and other conditions. Your body needs fat and cholesterol for basic functions, but eating too much of these things can be harmful to your health. Your health care provider may order lab tests to check your blood fat (lipid) and cholesterol levels. This helps your   health care provider understand your risk for certain conditions and whether you need to make diet changes. Work with your health care provider or dietitian to make an eating plan that is right for you. Your plan includes:  Limit your fat intake to ______% or less of your total calories a day.  Limit your saturated fat intake to ______% or less of your total calories a day.  Limit the amount of cholesterol in your diet to less than _________mg a day.  Eat ___________ g of fiber a day. What are tips for following this plan? General guidelines   If you are overweight, work with your health care provider to lose weight safely. Losing just 5-10% of your body weight can improve your overall health and help prevent diseases such as diabetes and heart disease.  Avoid: ? Foods with added sugar. ? Fried foods. ? Foods that contain partially hydrogenated oils, including stick margarine, some tub margarines, cookies, crackers, and other baked goods.  Limit alcohol intake to no more than 1 drink a day for nonpregnant women and 2 drinks a day for men. One drink equals 12 oz of beer, 5 oz of wine, or 1 oz of hard liquor. Reading food labels  Check food labels for: ? Trans fats, partially hydrogenated oils, or high amounts of saturated fat. Avoid foods that contain saturated fat and trans fat. ? The amount of cholesterol in each serving. Try to eat no more than 200 mg of cholesterol each day. ? The amount of fiber in each serving. Try to eat at least 20-30 g of fiber each day.  Choose foods with healthy fats, such as: ? Monounsaturated and polyunsaturated fats. These include olive and canola oil,  flaxseeds, walnuts, almonds, and seeds. ? Omega-3 fats. These are found in foods such as salmon, mackerel, sardines, tuna, flaxseed oil, and ground flaxseeds.  Choose grain products that have whole grains. Look for the word "whole" as the first word in the ingredient list. Cooking  Cook foods using methods other than frying. Baking, boiling, grilling, and broiling are some healthy options.  Eat more home-cooked food and less restaurant, buffet, and fast food.  Avoid cooking using saturated fats. ? Animal sources of saturated fats include meats, butter, and cream. ? Plant sources of saturated fats include palm oil, palm kernel oil, and coconut oil. Meal planning   At meals, imagine dividing your plate into fourths: ? Fill one-half of your plate with vegetables and green salads. ? Fill one-fourth of your plate with whole grains. ? Fill one-fourth of your plate with lean protein foods.  Eat fish that is high in omega-3 fats at least two times a week.  Eat more foods that contain fiber, such as whole grains, beans, apples, broccoli, carrots, peas, and barley. These foods help promote healthy cholesterol levels in the blood. Recommended foods Grains  Whole grains, such as whole wheat or whole grain breads, crackers, cereals, and pasta. Unsweetened oatmeal, bulgur, barley, quinoa, or brown rice. Corn or whole wheat flour tortillas. Vegetables  Fresh or frozen vegetables (raw, steamed, roasted, or grilled). Green salads. Fruits  All fresh, canned (in natural juice), or frozen fruits. Meats and other protein foods  Ground beef (85% or leaner), grass-fed beef, or beef trimmed of fat. Skinless chicken or turkey. Ground chicken or turkey. Pork trimmed of fat. All fish and seafood. Egg whites. Dried beans, peas, or lentils. Unsalted nuts or seeds. Unsalted canned beans. Natural nut butters without added sugar and oil. Dairy    Low-fat or nonfat dairy products, such as skim or 1% milk, 2% or  reduced-fat cheeses, low-fat and fat-free ricotta or cottage cheese, or plain low-fat and nonfat yogurt. Fats and oils  Tub margarine without trans fats. Light or reduced-fat mayonnaise and salad dressings. Avocado. Olive, canola, sesame, or safflower oils. The items listed above may not be a complete list of recommended foods or beverages. Contact your dietitian for more options. Foods to avoid Grains  White bread. White pasta. White rice. Cornbread. Bagels, pastries, and croissants. Crackers and snack foods that contain trans fat and hydrogenated oils. Vegetables  Vegetables cooked in cheese, cream, or butter sauce. Fried vegetables. Fruits  Canned fruit in heavy syrup. Fruit in cream or butter sauce. Fried fruit. Meats and other protein foods  Fatty cuts of meat. Ribs, chicken wings, bacon, sausage, bologna, salami, chitterlings, fatback, hot dogs, bratwurst, and packaged lunch meats. Liver and organ meats. Whole eggs and egg yolks. Chicken and turkey with skin. Fried meat. Dairy  Whole or 2% milk, cream, half-and-half, and cream cheese. Whole milk cheeses. Whole-fat or sweetened yogurt. Full-fat cheeses. Nondairy creamers and whipped toppings. Processed cheese, cheese spreads, and cheese curds. Beverages  Alcohol. Sugar-sweetened drinks such as sodas, lemonade, and fruit drinks. Fats and oils  Butter, stick margarine, lard, shortening, ghee, or bacon fat. Coconut, palm kernel, and palm oils. Sweets and desserts  Corn syrup, sugars, honey, and molasses. Candy. Jam and jelly. Syrup. Sweetened cereals. Cookies, pies, cakes, donuts, muffins, and ice cream. The items listed above may not be a complete list of foods and beverages to avoid. Contact your dietitian for more information. Summary  Your body needs fat and cholesterol for basic functions. However, eating too much of these things can be harmful to your health.  Work with your health care provider and dietitian to follow a  diet low in fat and cholesterol. Doing this may help lower your risk for heart disease and other conditions.  Choose healthy fats, such as monounsaturated and polyunsaturated fats, and foods high in omega-3 fatty acids.  Eat fiber-rich foods, such as whole grains, beans, peas, fruits, and vegetables.  Limit or avoid alcohol, fried foods, and foods high in saturated fats, partially hydrogenated oils, and sugar. This information is not intended to replace advice given to you by your health care provider. Make sure you discuss any questions you have with your health care provider. Document Released: 03/28/2005 Document Revised: 03/10/2017 Document Reviewed: 12/13/2016 Elsevier Patient Education  2020 [employer] Inc.  American Heart Association (AHA) Exercise Recommendation  Being physically active is important to prevent heart disease and stroke, the nation's No. 1and No. 5killers. To improve overall cardiovascular health, we suggest at least 150 minutes per week of moderate exercise or 75 minutes per week of vigorous exercise (or a combination of moderate and vigorous activity). Thirty minutes a day, five times a week is an easy goal to remember. You will also experience benefits even if you divide your time into two or three segments of 10 to 15 minutes per day.  For people who would benefit from lowering their blood pressure or cholesterol, we recommend 40 minutes of aerobic exercise of moderate to vigorous intensity three to four times a week to lower the risk for heart attack and stroke.  Physical activity is anything that makes you move your body and burn calories.  This includes things like climbing stairs or playing sports. Aerobic exercises benefit your heart, and include walking, jogging, swimming or biking. Strength and   stretching exercises are best for overall stamina and flexibility.  The simplest, positive change you can make to effectively improve your heart health is to start  walking. It's enjoyable, free, easy, social and great exercise. A walking program is flexible and boasts high success rates because people can stick with it. It's easy for walking to become a regular and satisfying part of life.   For Overall Cardiovascular Health:  At least 30 minutes of moderate-intensity aerobic activity at least 5 days per week for a total of 150  OR   At least 25 minutes of vigorous aerobic activity at least 3 days per week for a total of 75 minutes; or a combination of moderate- and vigorous-intensity aerobic activity  AND   Moderate- to high-intensity muscle-strengthening activity at least 2 days per week for additional health benefits.  For Lowering Blood Pressure and Cholesterol  An average 40 minutes of moderate- to vigorous-intensity aerobic activity 3 or 4 times per week  What if I can't make it to the time goal? Something is always better than nothing! And everyone has to start somewhere. Even if you've been sedentary for years, today is the day you can begin to make healthy changes in your life. If you don't think you'll make it for 30 or 40 minutes, set a reachable goal for today. You can work up toward your overall goal by increasing your time as you get stronger. Don't let all-or-nothing thinking rob you of doing what you can every day.  Source:http://www.heart.org    

## 2018-11-13 NOTE — Progress Notes (Signed)
Established Patient Office Visit  Subjective:  Patient ID: Patricia Estrada, female    DOB: [birth year]  Age: 43 y.o. MRN: [id number]  CC:  Chief Complaint  Patient presents with  . Annual Exam    HPI Patricia Estrada presents for Annual Exam. She is due for a pap - last screen 7 years ago.   Otherwise, she had a TOC visit a few weeks ago with myself. She had a full lab workup at that time that saw her A1c drop from 13.8% to 6.4%. She maintains high lipids. Otherwise, she feels that she is in overall good health.  She is interested in getting her Mirena removed and a new one inserted.   She is due for a diabetic eye exam and diabetic foot check. Referrals will be placed.   Past Medical History:  Diagnosis Date  . Eczema   . Essential hypertension   . Gestational diabetes   . Type 2 diabetes mellitus (HCC)     Past Surgical History:  Procedure Laterality Date  . EYE SURGERY    . WISDOM TOOTH EXTRACTION      Family History  Problem Relation Age of Onset  . Diabetes Mother   . Cancer Mother     Social History   Socioeconomic History  . Marital status: Married    Spouse name: Not on file  . Number of children: 1  . Years of education: Not on file  . Highest education level: Not on file  Occupational History  . Not on file  Social Needs  . Financial resource strain: Not hard at all  . Food insecurity    Worry: Never true    Inability: Never true  . Transportation needs    Medical: No    Non-medical: No  Tobacco Use  . Smoking status: Never Smoker  . Smokeless tobacco: Never Used  Substance and Sexual Activity  . Alcohol use: No  . Drug use: No  . Sexual activity: Not Currently    Birth control/protection: I.U.D.    Comment: mirena  Lifestyle  . Physical activity    Days per week: 0 days    Minutes per session: 0 min  . Stress: To some extent  Relationships  . Social Musicianconnections    Talks on phone: Three times a week    Gets  together: Twice a week    Attends religious service: Patient refused    Active member of club or organization: Patient refused    Attends meetings of clubs or organizations: Patient refused    Relationship status: Separated  . Intimate partner violence    Fear of current or ex partner: No    Emotionally abused: No    Physically abused: No    Forced sexual activity: No  Other Topics Concern  . Not on file  Social History Narrative  . Not on file    Outpatient Medications Prior to Visit  Medication Sig Dispense Refill  . atorvastatin (LIPITOR) 20 MG tablet Take 1 tablet (20 mg total) by mouth daily. 90 tablet 3  . dapagliflozin propanediol (FARXIGA) 5 MG TABS tablet Take 5 mg by mouth daily. 90 tablet 1  . glucose blood test strip Use as instructed 100 each 12  . levonorgestrel (MIRENA, 52 MG,) 20 MCG/24HR IUD by Intrauterine route.    Marland Kitchen. lisinopril (ZESTRIL) 10 MG tablet Take 1 tablet (10 mg total) by mouth daily. 90 tablet 1  . metFORMIN (GLUCOPHAGE) 1000 MG tablet Take 1 tablet (  1,000 mg total) by mouth 2 (two) times daily with a meal. 180 tablet 1  . triamcinolone cream (KENALOG) 0.5 % Apply 1 application topically 3 (three) times daily. 30 g 0   No facility-administered medications prior to visit.     Allergies  Allergen Reactions  . Penicillins     ROS Review of Systems  Constitutional: Negative.   HENT: Negative.   Eyes: Negative.   Respiratory: Negative.   Cardiovascular: Negative.   Gastrointestinal: Negative.   Endocrine: Negative.   Genitourinary: Negative.   Musculoskeletal: Negative.   Skin: Negative.   Allergic/Immunologic: Negative.   Neurological: Negative.   Hematological: Negative.   Psychiatric/Behavioral: Negative.       Objective:    Physical Exam  Constitutional: She is oriented to person, place, and time. She appears well-developed and well-nourished. No distress.  HENT:  Head: Normocephalic and atraumatic.  Right Ear: External ear  normal.  Left Ear: External ear normal.  Nose: Nose normal.  Mouth/Throat: Oropharynx is clear and moist. No oropharyngeal exudate.  Eyes: Pupils are equal, round, and reactive to light. Conjunctivae and EOM are normal. Right eye exhibits no discharge. Left eye exhibits no discharge. No scleral icterus.  Neck: Normal range of motion. Neck supple. No tracheal deviation present. No thyromegaly present.  Cardiovascular: Normal rate, regular rhythm, normal heart sounds and intact distal pulses. Exam reveals no gallop and no friction rub.  No murmur heard. Pulmonary/Chest: Effort normal and breath sounds normal. No respiratory distress. She has no wheezes. She has no rales. She exhibits no tenderness.  Abdominal: Soft. Bowel sounds are normal. She exhibits no distension and no mass. There is no abdominal tenderness. There is no rebound and no guarding.  Genitourinary:    Vagina and uterus normal.     Pelvic exam was performed with patient in the knee-chest position.  Uterus is not deviated, not enlarged, not fixed and not tender. Cervix exhibits discharge. Cervix exhibits no motion tenderness.    No vaginal discharge.   Musculoskeletal: Normal range of motion.        General: No tenderness, deformity or edema.  Lymphadenopathy:    She has no cervical adenopathy.  Neurological: She is alert and oriented to person, place, and time. No cranial nerve deficit. Coordination normal.  Skin: Skin is warm and dry. No rash noted. She is not diaphoretic. No erythema. No pallor.  Psychiatric: She has a normal mood and affect. Her behavior is normal. Judgment and thought content normal.  Nursing note and vitals reviewed.   BP 122/70   Pulse (!) 102   Temp 98.5 F (36.9 C) (Oral)   Resp 16   Ht 5' 9.88" (1.775 m)   Wt 227 lb (103 kg)   SpO2 100%   BMI 32.68 kg/m  Wt Readings from Last 3 Encounters:  11/13/18 227 lb (103 kg)  10/23/18 226 lb (102.5 kg)  10/17/17 224 lb (101.6 kg)     Health  Maintenance Due  Topic Date Due  . OPHTHALMOLOGY EXAM  05/03/1986  . HIV Screening  05/04/1991  . PAP SMEAR-Modifier  05/03/1997  . INFLUENZA VACCINE  11/10/2018    There are no preventive care reminders to display for this patient.  Lab Results  Component Value Date   TSH 2.950 10/23/2018   Lab Results  Component Value Date   WBC 7.9 10/23/2018   HGB 13.7 10/23/2018   HCT 41.2 10/23/2018   MCV 90 10/23/2018   PLT 257 10/23/2018   Lab  Results  Component Value Date   NA 139 10/23/2018   K 4.3 10/23/2018   CO2 21 10/23/2018   GLUCOSE 113 (H) 10/23/2018   BUN 12 10/23/2018   CREATININE 0.75 10/23/2018   BILITOT 0.3 10/23/2018   ALKPHOS 97 10/23/2018   AST 15 10/23/2018   ALT 20 10/23/2018   PROT 7.1 10/23/2018   ALBUMIN 4.5 10/23/2018   CALCIUM 9.8 10/23/2018   Lab Results  Component Value Date   CHOL 220 (H) 10/23/2018   Lab Results  Component Value Date   HDL 42 10/23/2018   Lab Results  Component Value Date   LDLCALC 144 (H) 10/23/2018   Lab Results  Component Value Date   TRIG 171 (H) 10/23/2018   Lab Results  Component Value Date   CHOLHDL 5.2 (H) 10/23/2018   Lab Results  Component Value Date   HGBA1C 6.4 (H) 10/23/2018      Assessment & Plan:   Problem List Items Addressed This Visit    None    Visit Diagnoses    Encounter for gynecological examination    -  Primary   Relevant Orders   Cytology - PAP([place])   Routine general medical examination at a health care facility          No orders of the defined types were placed in this encounter.   Follow-up: Return if symptoms worsen or fail to improve.   PLAN  Exam with normal findings.  Pap collected today, will follow results and discuss with patient if warranted  Otherwise, patient in good health. Preventative visit only today. She will return to clinic for Mirena removal/reinsert once we get prior auth.  Patient encouraged to call clinic with any questions, comments,  or concerns.    Maximiano Coss, NP

## 2018-11-15 LAB — CYTOLOGY - PAP: Diagnosis: NEGATIVE

## 2018-11-16 NOTE — Progress Notes (Signed)
Good morning Ms. Gwenlyn Found -  Your pap results have returned, they are normal.   Have a great day - and a great weekend!  Kathrin Ruddy, NP

## 2019-04-08 MED FILL — ATORVASTATIN 20 MG TABLET: 20 | 90 days supply | Qty: 90 | Fill #1

## 2019-04-08 MED FILL — LISINOPRIL 10 MG TABS: 10 | 90 days supply | Qty: 90 | Fill #1

## 2019-05-06 MED FILL — LISINOPRIL 10 MG TABS: 10 | 90 days supply | Qty: 90 | Fill #1

## 2019-05-06 MED FILL — metFORMIN HCL 1000 MG TABS: 1000 | 90 days supply | Qty: 180 | Fill #1

## 2019-05-06 MED FILL — ATORVASTATIN 20 MG TABLET: 20 | 90 days supply | Qty: 90 | Fill #1

## 2019-12-10 DIAGNOSIS — E119 Type 2 diabetes mellitus without complications: Secondary | ICD-10-CM | POA: Diagnosis not present

## 2019-12-10 DIAGNOSIS — Z9889 Other specified postprocedural states: Secondary | ICD-10-CM | POA: Diagnosis not present

## 2019-12-10 DIAGNOSIS — H5213 Myopia, bilateral: Secondary | ICD-10-CM | POA: Diagnosis not present

## 2020-04-08 DIAGNOSIS — Z03818 Encounter for observation for suspected exposure to other biological agents ruled out: Secondary | ICD-10-CM | POA: Diagnosis not present

## 2020-04-09 ENCOUNTER — Ambulatory Visit: Payer: 59

## 2020-05-19 DIAGNOSIS — Z20822 Contact with and (suspected) exposure to covid-19: Secondary | ICD-10-CM | POA: Diagnosis not present

## 2020-05-19 DIAGNOSIS — Z03818 Encounter for observation for suspected exposure to other biological agents ruled out: Secondary | ICD-10-CM | POA: Diagnosis not present

## 2020-08-27 ENCOUNTER — Ambulatory Visit: Payer: No Typology Code available for payment source | Admitting: Nurse Practitioner

## 2020-09-28 DIAGNOSIS — Z20822 Contact with and (suspected) exposure to covid-19: Secondary | ICD-10-CM | POA: Diagnosis not present

## 2020-09-28 DIAGNOSIS — U071 COVID-19: Secondary | ICD-10-CM | POA: Diagnosis not present

## 2020-09-28 DIAGNOSIS — Z0130 Encounter for examination of blood pressure without abnormal findings: Secondary | ICD-10-CM | POA: Diagnosis not present

## 2020-09-28 DIAGNOSIS — I10 Essential (primary) hypertension: Secondary | ICD-10-CM | POA: Diagnosis not present

## 2020-10-19 ENCOUNTER — Ambulatory Visit: Payer: Self-pay | Admitting: Nurse Practitioner

## 2020-11-04 DIAGNOSIS — Z20822 Contact with and (suspected) exposure to covid-19: Secondary | ICD-10-CM | POA: Diagnosis not present

## 2021-01-19 ENCOUNTER — Ambulatory Visit: Payer: 59 | Admitting: Registered Nurse

## 2021-01-19 ENCOUNTER — Other Ambulatory Visit (HOSPITAL_COMMUNITY): Payer: Self-pay

## 2021-01-19 ENCOUNTER — Other Ambulatory Visit: Payer: Self-pay

## 2021-01-19 ENCOUNTER — Encounter: Payer: Self-pay | Admitting: Registered Nurse

## 2021-01-19 VITALS — BP 160/78 | HR 75 | Temp 98.1°F | Resp 18 | Ht 69.0 in | Wt 216.0 lb

## 2021-01-19 DIAGNOSIS — E119 Type 2 diabetes mellitus without complications: Secondary | ICD-10-CM

## 2021-01-19 DIAGNOSIS — Z23 Encounter for immunization: Secondary | ICD-10-CM

## 2021-01-19 DIAGNOSIS — L309 Dermatitis, unspecified: Secondary | ICD-10-CM | POA: Diagnosis not present

## 2021-01-19 DIAGNOSIS — Z0000 Encounter for general adult medical examination without abnormal findings: Secondary | ICD-10-CM | POA: Diagnosis not present

## 2021-01-19 DIAGNOSIS — E785 Hyperlipidemia, unspecified: Secondary | ICD-10-CM | POA: Diagnosis not present

## 2021-01-19 DIAGNOSIS — Z1159 Encounter for screening for other viral diseases: Secondary | ICD-10-CM

## 2021-01-19 DIAGNOSIS — I10 Essential (primary) hypertension: Secondary | ICD-10-CM

## 2021-01-19 LAB — POCT GLYCOSYLATED HEMOGLOBIN (HGB A1C): Hemoglobin A1C: 8.1 % — AB (ref 4.0–5.6)

## 2021-01-19 MED ORDER — METFORMIN HCL 500 MG PO TABS
500.0000 mg | ORAL_TABLET | Freq: Two times a day (BID) | ORAL | 0 refills | Status: DC
  Filled 2021-01-19: qty 180, 90d supply, fill #0

## 2021-01-19 MED ORDER — TRIAMCINOLONE ACETONIDE 0.5 % EX CREA
1.0000 "application " | TOPICAL_CREAM | Freq: Three times a day (TID) | CUTANEOUS | 2 refills | Status: DC
  Filled 2021-01-19 (×2): qty 30, 10d supply, fill #0

## 2021-01-19 MED ORDER — TIRZEPATIDE 5 MG/0.5ML ~~LOC~~ SOAJ
5.0000 mg | SUBCUTANEOUS | 0 refills | Status: DC
  Filled 2021-01-19 – 2021-01-26 (×2): qty 6, 84d supply, fill #0

## 2021-01-19 MED ORDER — AZILSARTAN-CHLORTHALIDONE 40-12.5 MG PO TABS
1.0000 | ORAL_TABLET | Freq: Every day | ORAL | 0 refills | Status: DC
  Filled 2021-01-19 – 2021-01-26 (×2): qty 90, 90d supply, fill #0
  Filled 2021-03-03: qty 30, 30d supply, fill #0

## 2021-01-19 MED ORDER — ATORVASTATIN CALCIUM 20 MG PO TABS
20.0000 mg | ORAL_TABLET | Freq: Every day | ORAL | 0 refills | Status: DC
  Filled 2021-01-19: qty 90, 90d supply, fill #0

## 2021-01-19 MED ORDER — DAPAGLIFLOZIN PROPANEDIOL 5 MG PO TABS
5.0000 mg | ORAL_TABLET | Freq: Every day | ORAL | 0 refills | Status: DC
  Filled 2021-01-19: qty 90, 90d supply, fill #0

## 2021-01-19 NOTE — Patient Instructions (Addendum)
Ms Cori Justus to see you again!   Sugars and pressure a little high. Plan as discussed.  Will see you in 3 mo for repeat A1c and recheck on pressure  I'll call if labs are looking funky.  Let me know if you need anything  Rich    If you have lab work done today you will be contacted with your lab results within the next 2 weeks.  If you have not heard from [place] then please contact us. The fastest way to get your results is to register for My Chart.   IF you received an x-ray today, you will receive an invoice from [facility]. Please contact [facility] at [phone] with questions or concerns regarding your invoice.   IF you received labwork today, you will receive an invoice from [facility]. Please contact [facility] at [phone] with questions or concerns regarding your invoice.   Our billing staff will not be able to assist you with questions regarding bills from these companies.  You will be contacted with the lab results as soon as they are available. The fastest way to get your results is to activate your My Chart account. Instructions are located on the last page of this paperwork. If you have not heard from [place] regarding the results in 2 weeks, please contact this office.

## 2021-01-19 NOTE — Progress Notes (Signed)
Established Patient Office Visit  Subjective:  Patient ID: Patricia Estrada, female    DOB: 17-Feb-1976  Age: 45 y.o. MRN: [id number]  CC:  Chief Complaint  Patient presents with   New Patient (Initial Visit)    Patient states she is here to establish care and get a CPE.    HPI [name] presents for visit to est care and CPE  Pt was under my care at [facility].  Histories reviewed and updated with patient.   t2dm Last A1c:  Lab Results  Component Value Date   HGBA1C 8.1 (A) 01/19/2021    Currently taking: none at this time No new complications Reports good compliance with medications Diet has been so-so Exercise habits have been limited  Hypertension: Patient Currently taking: no meds at this time. Good effect. No AEs. Denies CV symptoms including: chest pain, shob, doe, headache, visual changes, fatigue, claudication, and dependent edema.   Previous readings and labs: BP Readings from Last 3 Encounters:  01/19/21 (!) 160/78  11/13/18 122/70  10/23/18 (!) 160/90   Lab Results  Component Value Date   CREATININE 0.75 10/23/2018    Flu vaccine Requesting today No AE in past  Health maintenance:  Otherwise no acute concerns.  Past Medical History:  Diagnosis Date   Eczema    Essential hypertension    Gestational diabetes    Type 2 diabetes mellitus (HCC)     Past Surgical History:  Procedure Laterality Date   EYE SURGERY     WISDOM TOOTH EXTRACTION      Family History  Problem Relation Age of Onset   Diabetes Mother    Cancer Mother     Social History   Socioeconomic History   Marital status: Married    Spouse name: Not on file   Number of children: 1   Years of education: Not on file   Highest education level: Not on file  Occupational History   Not on file  Tobacco Use   Smoking status: Never   Smokeless tobacco: Never  Vaping Use   Vaping Use: Never used  Substance and Sexual Activity   Alcohol use: No    Drug use: No   Sexual activity: Not Currently    Birth control/protection: I.U.D.    Comment: mirena  Other Topics Concern   Not on file  Social History Narrative   Not on file   Social Determinants of Health   Financial Resource Strain: Not on file  Food Insecurity: Not on file  Transportation Needs: Not on file  Physical Activity: Not on file  Stress: Not on file  Social Connections: Not on file  Intimate Partner Violence: Not on file    Outpatient Medications Prior to Visit  Medication Sig Dispense Refill   glucose blood test strip Use as instructed 100 each 12   levonorgestrel (MIRENA) 20 MCG/24HR IUD by Intrauterine route.     atorvastatin (LIPITOR) 20 MG tablet Take 1 tablet (20 mg total) by mouth daily. 90 tablet 3   dapagliflozin propanediol (FARXIGA) 5 MG TABS tablet Take 5 mg by mouth daily. 90 tablet 1   lisinopril (ZESTRIL) 10 MG tablet Take 1 tablet (10 mg total) by mouth daily. 90 tablet 1   metFORMIN (GLUCOPHAGE) 1000 MG tablet Take 1 tablet (1,000 mg total) by mouth 2 (two) times daily with a meal. 180 tablet 1   triamcinolone cream (KENALOG) 0.5 % Apply 1 application topically 3 (three) times daily. 30 g 0  No facility-administered medications prior to visit.    Allergies  Allergen Reactions   Penicillins     ROS Review of Systems  Constitutional: Negative.   HENT: Negative.    Eyes: Negative.   Respiratory: Negative.    Cardiovascular: Negative.   Gastrointestinal: Negative.   Genitourinary: Negative.   Musculoskeletal: Negative.   Skin: Negative.   Neurological: Negative.   Psychiatric/Behavioral: Negative.    All other systems reviewed and are negative.    Objective:    Physical Exam Vitals and nursing note reviewed.  Constitutional:      General: She is not in acute distress.    Appearance: Normal appearance. She is normal weight. She is not ill-appearing, toxic-appearing or diaphoretic.  HENT:     Head: Normocephalic and atraumatic.      Right Ear: Tympanic membrane, ear canal and external ear normal. There is no impacted cerumen.     Left Ear: Tympanic membrane, ear canal and external ear normal. There is no impacted cerumen.     Nose: Nose normal. No congestion or rhinorrhea.     Mouth/Throat:     Mouth: Mucous membranes are moist.     Pharynx: Oropharynx is clear. No oropharyngeal exudate or posterior oropharyngeal erythema.  Eyes:     General: No scleral icterus.       Right eye: No discharge.        Left eye: No discharge.     Extraocular Movements: Extraocular movements intact.     Conjunctiva/sclera: Conjunctivae normal.     Pupils: Pupils are equal, round, and reactive to light.  Cardiovascular:     Rate and Rhythm: Normal rate and regular rhythm.     Pulses: Normal pulses.     Heart sounds: Normal heart sounds. No murmur heard.   No friction rub. No gallop.  Pulmonary:     Effort: Pulmonary effort is normal. No respiratory distress.     Breath sounds: Normal breath sounds. No stridor. No wheezing, rhonchi or rales.  Chest:     Chest wall: No tenderness.  Abdominal:     General: Abdomen is flat. Bowel sounds are normal. There is no distension.     Palpations: Abdomen is soft. There is no mass.     Tenderness: There is no abdominal tenderness. There is no right CVA tenderness, left CVA tenderness, guarding or rebound.     Hernia: No hernia is present.  Musculoskeletal:        General: No swelling, tenderness, deformity or signs of injury. Normal range of motion.     Right lower leg: No edema.     Left lower leg: No edema.  Skin:    General: Skin is warm and dry.     Capillary Refill: Capillary refill takes less than 2 seconds.     Coloration: Skin is not jaundiced or pale.     Findings: No bruising, erythema, lesion or rash.  Neurological:     General: No focal deficit present.     Mental Status: She is alert and oriented to person, place, and time. Mental status is at baseline.     Cranial Nerves:  No cranial nerve deficit.     Sensory: No sensory deficit.     Motor: No weakness.     Coordination: Coordination normal.     Gait: Gait normal.     Deep Tendon Reflexes: Reflexes normal.  Psychiatric:        Mood and Affect: Mood normal.  Behavior: Behavior normal.        Thought Content: Thought content normal.        Judgment: Judgment normal.    BP (!) 160/78   Pulse 75   Temp 98.1 F (36.7 C) (Temporal)   Resp 18   Ht 5\' 9"  (1.753 m)   Wt 216 lb (98 kg)   SpO2 100%   BMI 31.90 kg/m  Wt Readings from Last 3 Encounters:  01/19/21 216 lb (98 kg)  11/13/18 227 lb (103 kg)  10/23/18 226 lb (102.5 kg)     Health Maintenance Due  Topic Date Due   URINE MICROALBUMIN  09/30/2018   INFLUENZA VACCINE  11/09/2020    There are no preventive care reminders to display for this patient.  Lab Results  Component Value Date   TSH 2.950 10/23/2018   Lab Results  Component Value Date   WBC 7.9 10/23/2018   HGB 13.7 10/23/2018   HCT 41.2 10/23/2018   MCV 90 10/23/2018   PLT 257 10/23/2018   Lab Results  Component Value Date   NA 139 10/23/2018   K 4.3 10/23/2018   CO2 21 10/23/2018   GLUCOSE 113 (H) 10/23/2018   BUN 12 10/23/2018   CREATININE 0.75 10/23/2018   BILITOT 0.3 10/23/2018   ALKPHOS 97 10/23/2018   AST 15 10/23/2018   ALT 20 10/23/2018   PROT 7.1 10/23/2018   ALBUMIN 4.5 10/23/2018   CALCIUM 9.8 10/23/2018   Lab Results  Component Value Date   CHOL 220 (H) 10/23/2018   Lab Results  Component Value Date   HDL 42 10/23/2018   Lab Results  Component Value Date   LDLCALC 144 (H) 10/23/2018   Lab Results  Component Value Date   TRIG 171 (H) 10/23/2018   Lab Results  Component Value Date   CHOLHDL 5.2 (H) 10/23/2018   Lab Results  Component Value Date   HGBA1C 8.1 (A) 01/19/2021      Assessment & Plan:   Problem List Items Addressed This Visit       Cardiovascular and Mediastinum   Essential hypertension   Relevant  Medications   atorvastatin (LIPITOR) 20 MG tablet   Azilsartan-Chlorthalidone 40-12.5 MG TABS   Other Relevant Orders   CBC with Differential/Platelet   TSH     Endocrine   Type 2 diabetes mellitus without complication, without long-term current use of insulin (HCC) - Primary   Relevant Medications   dapagliflozin propanediol (FARXIGA) 5 MG TABS tablet   atorvastatin (LIPITOR) 20 MG tablet   Azilsartan-Chlorthalidone 40-12.5 MG TABS   metFORMIN (GLUCOPHAGE) 500 MG tablet   tirzepatide (MOUNJARO) 5 MG/0.5ML Pen   Other Relevant Orders   POCT glycosylated hemoglobin (Hb A1C) (Completed)   CBC with Differential/Platelet   Comprehensive metabolic panel   Lipid panel   TSH   Ambulatory referral to Podiatry   Ambulatory referral to Ophthalmology   Other Visit Diagnoses     Flu vaccine need       Relevant Orders   Flu Vaccine QUAD 6+ mos PF IM (Fluarix Quad PF)   Hyperlipidemia, unspecified hyperlipidemia type       Relevant Medications   atorvastatin (LIPITOR) 20 MG tablet   Azilsartan-Chlorthalidone 40-12.5 MG TABS   Other Relevant Orders   Lipid panel   TSH   Eczema, unspecified type       Relevant Medications   triamcinolone cream (KENALOG) 0.5 %   Other Relevant Orders   CBC with Differential/Platelet  Encounter for screening for other viral diseases       Relevant Orders   HIV antibody (with reflex)   Hepatitis C Antibody   Physical exam           Meds ordered this encounter  Medications   dapagliflozin propanediol (FARXIGA) 5 MG TABS tablet    Sig: Take 1 tablet (5 mg total) by mouth daily.    Dispense:  90 tablet    Refill:  0    Order Specific Question:   Supervising Provider    Answer:   Neva Seat, JEFFREY R [[id number]]   atorvastatin (LIPITOR) 20 MG tablet    Sig: Take 1 tablet (20 mg total) by mouth daily.    Dispense:  90 tablet    Refill:  0    Order Specific Question:   Supervising Provider    Answer:   Neva Seat, JEFFREY R [[id number]]   triamcinolone cream  (KENALOG) 0.5 %    Sig: Apply 1 application topically 3 (three) times daily.    Dispense:  30 g    Refill:  2    Order Specific Question:   Supervising Provider    Answer:   Neva Seat, JEFFREY R [[id number]]   Azilsartan-Chlorthalidone 40-12.5 MG TABS    Sig: Take 1 tablet by mouth daily.    Dispense:  90 tablet    Refill:  0    Order Specific Question:   Supervising Provider    Answer:   Neva Seat, JEFFREY R [[id number]]   metFORMIN (GLUCOPHAGE) 500 MG tablet    Sig: Take 1 tablet (500 mg total) by mouth 2 (two) times daily with a meal.    Dispense:  180 tablet    Refill:  0    Order Specific Question:   Supervising Provider    Answer:   Neva Seat, JEFFREY R [[id number]]   tirzepatide (MOUNJARO) 5 MG/0.5ML Pen    Sig: Inject 5 mg into the skin once a week.    Dispense:  6 mL    Refill:  0    Order Specific Question:   Supervising Provider    Answer:   Neva Seat, JEFFREY R [[id number]]    Follow-up: Return in about 3 months (around 04/21/2021) for t2dm, htn.   PLAN CPE unremarkable Labs collected. Will follow up with the patient as warranted. Resume farxiga. Start metformin at 500mg  po bid ac. Will seek coverage for [facility] as she has some GI side effects from metformin. Start azilsartan-chlorthalidone 40-12.5mg  po qd for htn.  Recheck in 3 mo Patient encouraged to call clinic with any questions, comments, or concerns.  [facility], NP

## 2021-01-20 ENCOUNTER — Other Ambulatory Visit (HOSPITAL_COMMUNITY): Payer: Self-pay

## 2021-01-20 DIAGNOSIS — I10 Essential (primary) hypertension: Secondary | ICD-10-CM | POA: Diagnosis not present

## 2021-01-20 DIAGNOSIS — E119 Type 2 diabetes mellitus without complications: Secondary | ICD-10-CM | POA: Diagnosis not present

## 2021-01-20 DIAGNOSIS — Z23 Encounter for immunization: Secondary | ICD-10-CM | POA: Diagnosis not present

## 2021-01-20 DIAGNOSIS — E785 Hyperlipidemia, unspecified: Secondary | ICD-10-CM | POA: Diagnosis not present

## 2021-01-20 DIAGNOSIS — Z1159 Encounter for screening for other viral diseases: Secondary | ICD-10-CM | POA: Diagnosis not present

## 2021-01-20 DIAGNOSIS — Z0000 Encounter for general adult medical examination without abnormal findings: Secondary | ICD-10-CM | POA: Diagnosis not present

## 2021-01-20 DIAGNOSIS — L309 Dermatitis, unspecified: Secondary | ICD-10-CM | POA: Diagnosis not present

## 2021-01-26 ENCOUNTER — Other Ambulatory Visit (HOSPITAL_COMMUNITY): Payer: Self-pay

## 2021-01-26 ENCOUNTER — Encounter: Payer: Self-pay | Admitting: Registered Nurse

## 2021-01-27 ENCOUNTER — Other Ambulatory Visit (HOSPITAL_COMMUNITY): Payer: Self-pay

## 2021-01-27 ENCOUNTER — Telehealth: Payer: Self-pay

## 2021-01-27 NOTE — Telephone Encounter (Signed)
A PA was processed for patient for Edarbyclor 40-12.5mg . Waiting for approval/denial from [employer].

## 2021-01-29 ENCOUNTER — Other Ambulatory Visit (HOSPITAL_COMMUNITY): Payer: Self-pay

## 2021-02-01 ENCOUNTER — Other Ambulatory Visit (HOSPITAL_COMMUNITY): Payer: Self-pay

## 2021-02-02 ENCOUNTER — Other Ambulatory Visit (HOSPITAL_COMMUNITY): Payer: Self-pay

## 2021-02-10 ENCOUNTER — Other Ambulatory Visit (HOSPITAL_COMMUNITY): Payer: Self-pay

## 2021-02-23 ENCOUNTER — Encounter: Payer: 59 | Admitting: Obstetrics & Gynecology

## 2021-03-03 ENCOUNTER — Other Ambulatory Visit (HOSPITAL_COMMUNITY): Payer: Self-pay

## 2021-04-21 ENCOUNTER — Other Ambulatory Visit (HOSPITAL_COMMUNITY): Payer: Self-pay

## 2021-04-21 ENCOUNTER — Other Ambulatory Visit: Payer: Self-pay

## 2021-04-21 ENCOUNTER — Encounter: Payer: Self-pay | Admitting: Registered Nurse

## 2021-04-21 ENCOUNTER — Ambulatory Visit (INDEPENDENT_AMBULATORY_CARE_PROVIDER_SITE_OTHER): Payer: 59 | Admitting: Registered Nurse

## 2021-04-21 VITALS — BP 146/73 | HR 90 | Temp 98.4°F | Resp 16 | Ht 69.0 in | Wt 196.4 lb

## 2021-04-21 DIAGNOSIS — E119 Type 2 diabetes mellitus without complications: Secondary | ICD-10-CM

## 2021-04-21 DIAGNOSIS — I10 Essential (primary) hypertension: Secondary | ICD-10-CM

## 2021-04-21 LAB — POCT GLYCOSYLATED HEMOGLOBIN (HGB A1C): Hemoglobin A1C: 4.8 % (ref 4.0–5.6)

## 2021-04-21 MED ORDER — AMLODIPINE BESY-BENAZEPRIL HCL 10-20 MG PO CAPS
1.0000 | ORAL_CAPSULE | Freq: Every day | ORAL | 1 refills | Status: DC
  Filled 2021-04-21: qty 90, 90d supply, fill #0

## 2021-04-21 MED ORDER — TIRZEPATIDE 10 MG/0.5ML ~~LOC~~ SOAJ
10.0000 mg | SUBCUTANEOUS | 1 refills | Status: DC
  Filled 2021-04-21: qty 2, 28d supply, fill #0

## 2021-04-21 NOTE — Progress Notes (Signed)
Established Patient Office Visit  Subjective:  Patient ID: Patricia Estrada, female    DOB: 1975-04-24  Age: 46 y.o. MRN: [id number]  CC:  Chief Complaint  Patient presents with   Follow-up    Patient states she is here for f/u on diabetes.    HPI Patricia Estrada presents for t2dm follow up   Last A1c: 8.1 on 01/19/21 Currently taking: mounjaro 5mg  po qd No new complications Reports good compliance with medications Diet has been improved Exercise habits have been steady  Hypertension: Patient Currently taking: no medications. Azilsartan-chlorthalidone not covered.  Denies CV symptoms including: chest pain, shob, doe, headache, visual changes, fatigue, claudication, and dependent edema.   Previous readings and labs: BP Readings from Last 3 Encounters:  04/21/21 (!) 146/73  01/19/21 (!) 160/78  11/13/18 122/70   Lab Results  Component Value Date   CREATININE 0.75 10/23/2018       Past Medical History:  Diagnosis Date   Eczema    Essential hypertension    Gestational diabetes    Type 2 diabetes mellitus (HCC)     Past Surgical History:  Procedure Laterality Date   EYE SURGERY     WISDOM TOOTH EXTRACTION      Family History  Problem Relation Age of Onset   Diabetes Mother    Cancer Mother     Social History   Socioeconomic History   Marital status: Married    Spouse name: Not on file   Number of children: 1   Years of education: Not on file   Highest education level: Not on file  Occupational History   Not on file  Tobacco Use   Smoking status: Never   Smokeless tobacco: Never  Vaping Use   Vaping Use: Never used  Substance and Sexual Activity   Alcohol use: No   Drug use: No   Sexual activity: Not Currently    Birth control/protection: I.U.D.    Comment: mirena  Other Topics Concern   Not on file  Social History Narrative   Not on file   Social Determinants of Health   Financial Resource Strain: Not on file  Food  Insecurity: Not on file  Transportation Needs: Not on file  Physical Activity: Not on file  Stress: Not on file  Social Connections: Not on file  Intimate Partner Violence: Not on file    Outpatient Medications Prior to Visit  Medication Sig Dispense Refill   atorvastatin (LIPITOR) 20 MG tablet Take 1 tablet (20 mg total) by mouth daily. 90 tablet 0   glucose blood test strip Use as instructed 100 each 12   levonorgestrel (MIRENA) 20 MCG/24HR IUD by Intrauterine route.     triamcinolone cream (KENALOG) 0.5 % Apply 1 application topically 3 (three) times daily. 30 g 2   Azilsartan-Chlorthalidone 40-12.5 MG TABS Take 1 tablet by mouth daily. 90 tablet 0   dapagliflozin propanediol (FARXIGA) 5 MG TABS tablet Take 1 tablet (5 mg total) by mouth daily. 90 tablet 0   metFORMIN (GLUCOPHAGE) 500 MG tablet Take 1 tablet (500 mg total) by mouth 2 (two) times daily with a meal. 180 tablet 0   tirzepatide (MOUNJARO) 5 MG/0.5ML Pen Inject 5 mg into the skin once a week. 6 mL 0   No facility-administered medications prior to visit.    Allergies  Allergen Reactions   Penicillins     ROS Review of Systems  Constitutional: Negative.   HENT: Negative.    Eyes: Negative.  Respiratory: Negative.    Cardiovascular: Negative.   Gastrointestinal: Negative.   Genitourinary: Negative.   Musculoskeletal: Negative.   Skin: Negative.   Neurological: Negative.   Psychiatric/Behavioral: Negative.    All other systems reviewed and are negative.    Objective:    Physical Exam Vitals and nursing note reviewed.  Constitutional:      General: She is not in acute distress.    Appearance: Normal appearance. She is normal weight. She is not ill-appearing, toxic-appearing or diaphoretic.  Cardiovascular:     Rate and Rhythm: Normal rate and regular rhythm.     Heart sounds: Normal heart sounds. No murmur heard.   No friction rub. No gallop.  Pulmonary:     Effort: Pulmonary effort is normal. No  respiratory distress.     Breath sounds: Normal breath sounds. No stridor. No wheezing, rhonchi or rales.  Chest:     Chest wall: No tenderness.  Skin:    General: Skin is warm and dry.  Neurological:     General: No focal deficit present.     Mental Status: She is alert and oriented to person, place, and time. Mental status is at baseline.  Psychiatric:        Mood and Affect: Mood normal.        Behavior: Behavior normal.        Thought Content: Thought content normal.        Judgment: Judgment normal.    BP (!) 146/73    Pulse 90    Temp 98.4 F (36.9 C) (Temporal)    Resp 16    Ht 5\' 9"  (1.753 m)    Wt 196 lb 6.4 oz (89.1 kg)    SpO2 100%    BMI 29.00 kg/m  Wt Readings from Last 3 Encounters:  04/21/21 196 lb 6.4 oz (89.1 kg)  01/19/21 216 lb (98 kg)  11/13/18 227 lb (103 kg)     Health Maintenance Due  Topic Date Due   OPHTHALMOLOGY EXAM  Never done   Pneumococcal Vaccine 33-30 Years old (2 - PCV) 09/30/2018   COVID-19 Vaccine (3 - Booster for Pfizer series) 11/08/2020    There are no preventive care reminders to display for this patient.  Lab Results  Component Value Date   TSH 2.950 10/23/2018   Lab Results  Component Value Date   WBC 7.9 10/23/2018   HGB 13.7 10/23/2018   HCT 41.2 10/23/2018   MCV 90 10/23/2018   PLT 257 10/23/2018   Lab Results  Component Value Date   NA 139 10/23/2018   K 4.3 10/23/2018   CO2 21 10/23/2018   GLUCOSE 113 (H) 10/23/2018   BUN 12 10/23/2018   CREATININE 0.75 10/23/2018   BILITOT 0.3 10/23/2018   ALKPHOS 97 10/23/2018   AST 15 10/23/2018   ALT 20 10/23/2018   PROT 7.1 10/23/2018   ALBUMIN 4.5 10/23/2018   CALCIUM 9.8 10/23/2018   Lab Results  Component Value Date   CHOL 220 (H) 10/23/2018   Lab Results  Component Value Date   HDL 42 10/23/2018   Lab Results  Component Value Date   LDLCALC 144 (H) 10/23/2018   Lab Results  Component Value Date   TRIG 171 (H) 10/23/2018   Lab Results  Component Value  Date   CHOLHDL 5.2 (H) 10/23/2018   Lab Results  Component Value Date   HGBA1C 4.8 04/21/2021      Assessment & Plan:   Problem List Items Addressed  This Visit       Cardiovascular and Mediastinum   Essential hypertension   Relevant Medications   amLODipine-benazepril (LOTREL) 10-20 MG capsule     Endocrine   Type 2 diabetes mellitus without complication, without long-term current use of insulin (HCC) - Primary   Relevant Medications   tirzepatide (MOUNJARO) 10 MG/0.5ML Pen   amLODipine-benazepril (LOTREL) 10-20 MG capsule   Other Relevant Orders   POCT glycosylated hemoglobin (Hb A1C) (Completed)    Meds ordered this encounter  Medications   tirzepatide (MOUNJARO) 10 MG/0.5ML Pen    Sig: Inject 10 mg into the skin once a week.    Dispense:  6 mL    Refill:  1    Order Specific Question:   Supervising Provider    Answer:   Carlota Raspberry, JEFFREY R [[id number]]   amLODipine-benazepril (LOTREL) 10-20 MG capsule    Sig: Take 1 capsule by mouth daily.    Dispense:  90 capsule    Refill:  1    Order Specific Question:   Supervising Provider    Answer:   Carlota Raspberry, JEFFREY R [[id number]]    Follow-up: No follow-ups on file.   PLAN Great improvement of A1c to 4.8 Increase mounjaro to 10mg  weekly. Stop metformin Change antihypertensives to amlodipine-benazepril 10-20mg  po qd.  Return in 6 mo for cpe and labs Patient encouraged to call clinic with any questions, comments, or concerns.  Maximiano Coss, NP

## 2021-04-21 NOTE — Patient Instructions (Signed)
° ° ° °  If you have lab work done today you will be contacted with your lab results within the next 2 weeks.  If you have not heard from us then please contact us. The fastest way to get your results is to register for My Chart. ° ° °IF you received an x-ray today, you will receive an invoice from [facility]. Please contact [facility] at [phone] with questions or concerns regarding your invoice.  ° °IF you received labwork today, you will receive an invoice from [facility]. Please contact [facility] at [phone] with questions or concerns regarding your invoice.  ° °Our billing staff will not be able to assist you with questions regarding bills from these companies. ° °You will be contacted with the lab results as soon as they are available. The fastest way to get your results is to activate your My Chart account. Instructions are located on the last page of this paperwork. If you have not heard from us regarding the results in 2 weeks, please contact this office. °  ° ° ° °

## 2021-04-22 ENCOUNTER — Other Ambulatory Visit: Payer: Self-pay | Admitting: Registered Nurse

## 2021-05-09 ENCOUNTER — Encounter: Payer: Self-pay | Admitting: Registered Nurse

## 2021-05-10 ENCOUNTER — Other Ambulatory Visit (HOSPITAL_COMMUNITY): Payer: Self-pay

## 2021-05-10 ENCOUNTER — Other Ambulatory Visit: Payer: Self-pay | Admitting: Registered Nurse

## 2021-05-10 DIAGNOSIS — E119 Type 2 diabetes mellitus without complications: Secondary | ICD-10-CM

## 2021-05-10 MED ORDER — TIRZEPATIDE 10 MG/0.5ML ~~LOC~~ SOAJ
10.0000 mg | SUBCUTANEOUS | 1 refills | Status: DC
Start: 2021-05-10 — End: 2021-10-18
  Filled 2021-05-10: qty 2, 28d supply, fill #0
  Filled 2021-06-06: qty 2, 28d supply, fill #1
  Filled 2021-06-28: qty 2, 28d supply, fill #2
  Filled 2021-07-19: qty 2, 28d supply, fill #3
  Filled 2021-08-16: qty 2, 28d supply, fill #4
  Filled 2021-09-08: qty 2, 28d supply, fill #5

## 2021-05-10 NOTE — Telephone Encounter (Signed)
Patient would like to know if her prescription for [facility] can be switched to a 3 month supply being that it is cheaper.

## 2021-05-10 NOTE — Telephone Encounter (Signed)
Called to inform patient that the medication was at the pharmacy.

## 2021-05-13 ENCOUNTER — Ambulatory Visit: Payer: 59 | Admitting: Sports Medicine

## 2021-05-13 ENCOUNTER — Other Ambulatory Visit (HOSPITAL_COMMUNITY): Payer: Self-pay

## 2021-05-26 ENCOUNTER — Encounter: Payer: Self-pay | Admitting: Registered Nurse

## 2021-06-03 ENCOUNTER — Other Ambulatory Visit: Payer: Self-pay

## 2021-06-03 ENCOUNTER — Ambulatory Visit (INDEPENDENT_AMBULATORY_CARE_PROVIDER_SITE_OTHER): Payer: 59 | Admitting: Sports Medicine

## 2021-06-03 DIAGNOSIS — E119 Type 2 diabetes mellitus without complications: Secondary | ICD-10-CM

## 2021-06-03 NOTE — Progress Notes (Signed)
Subjective: Patricia Estrada is a 46 y.o. female patient with history of diabetes who presents to office today for diabetic foot exam was referred by PCP Janeece Agee, NP, patient reports her blood sugar this morning was 135 last A1c 6.8.  Patient reports that she has been diabetic for the last 7 to 8 years initially had gestational diabetes over 14 years ago but that went away and then later developed type 2 diabetes.  Patient denies any tingling numbness burning or sharp shooting symptoms or pain in the feet.  Patient does admit to some nail changes that she has been self treating with over-the-counter antifungal cream right first and fourth toenail and does admit that in the past her fourth toenail came off but did not hurt or bleed.  Patient denies any other acute concerns at this time.  Patient Active Problem List   Diagnosis Date Noted   Essential hypertension 10/18/2017   Type 2 diabetes mellitus without complication, without long-term current use of insulin (HCC) 09/29/2017   Class 1 obesity with serious comorbidity and body mass index (BMI) of 31.0 to 31.9 in adult 09/29/2017   Current Outpatient Medications on File Prior to Visit  Medication Sig Dispense Refill   amLODipine-benazepril (LOTREL) 10-20 MG capsule Take 1 capsule by mouth daily. 90 capsule 1   atorvastatin (LIPITOR) 20 MG tablet Take 1 tablet (20 mg total) by mouth daily. 90 tablet 0   glucose blood test strip Use as instructed 100 each 12   levonorgestrel (MIRENA) 20 MCG/24HR IUD by Intrauterine route.     tirzepatide (MOUNJARO) 10 MG/0.5ML Pen Inject 10 mg into the skin once a week. 6 mL 1   triamcinolone cream (KENALOG) 0.5 % Apply 1 application topically 3 (three) times daily. 30 g 2   No current facility-administered medications on file prior to visit.   Allergies  Allergen Reactions   Penicillins Other (See Comments)    childhood childhood    Recent Results (from the past 2160 hour(s))  POCT  glycosylated hemoglobin (Hb A1C)     Status: Normal   Collection Time: 04/21/21 11:05 AM  Result Value Ref Range   Hemoglobin A1C 4.8 4.0 - 5.6 %   HbA1c POC (<> result, manual entry)     HbA1c, POC (prediabetic range)     HbA1c, POC (controlled diabetic range)      Objective: General: Patient is awake, alert, and oriented x 3 and in no acute distress.  Integument: Skin is warm, dry and supple bilateral. Nails are polished bilateral. No signs of infection. No open lesions or preulcerative lesions present bilateral. Remaining integument unremarkable.  Vasculature:  Dorsalis Pedis pulse 2/4 bilateral. Posterior Tibial pulse 2/4 bilateral.  Capillary fill time <3 sec 1-5 bilateral. Positive hair growth to the level of the digits. Temperature gradient within normal limits. No varicosities present bilateral. No edema present bilateral.   Neurology: The patient has intact sensation measured with a 5.07/10g Semmes Weinstein Monofilament at all pedal sites bilateral . Vibratory sensation slightly diminished bilateral with tuning fork. No Babinski sign present bilateral.   Musculoskeletal: Asymptomatic pes planus pedal deformities noted bilateral. Muscular strength 5/5 in all lower extremity muscular groups bilateral without pain on range of motion.  Assessment and Plan: Problem List Items Addressed This Visit   None Visit Diagnoses     Comprehensive diabetic foot examination, type 2 DM, encounter for [facility])    -  Primary       -Examined patient. -Discussed and educated patient  on diabetic foot care, especially with  regards to the vascular, neurological and musculoskeletal systems.  -Stressed the importance of good glycemic control and the detriment of not  controlling glucose levels in relation to the foot. -Advised patient to return to office next visit without polish so that way I can more thoroughly evaluate her nail concerns -Answered all patient questions -Patient to return as  scheduled for evaluation for possible nail fungus -Patient advised to call the office if any problems or questions arise in the meantime.  Asencion Islam, DPM

## 2021-06-07 ENCOUNTER — Other Ambulatory Visit (HOSPITAL_COMMUNITY): Payer: Self-pay

## 2021-06-28 ENCOUNTER — Other Ambulatory Visit (HOSPITAL_COMMUNITY): Payer: Self-pay

## 2021-07-01 ENCOUNTER — Ambulatory Visit (INDEPENDENT_AMBULATORY_CARE_PROVIDER_SITE_OTHER): Payer: 59 | Admitting: Sports Medicine

## 2021-07-01 ENCOUNTER — Encounter: Payer: Self-pay | Admitting: Sports Medicine

## 2021-07-01 ENCOUNTER — Other Ambulatory Visit: Payer: Self-pay

## 2021-07-01 DIAGNOSIS — E119 Type 2 diabetes mellitus without complications: Secondary | ICD-10-CM | POA: Diagnosis not present

## 2021-07-01 DIAGNOSIS — L603 Nail dystrophy: Secondary | ICD-10-CM | POA: Diagnosis not present

## 2021-07-01 DIAGNOSIS — B351 Tinea unguium: Secondary | ICD-10-CM | POA: Diagnosis not present

## 2021-07-01 DIAGNOSIS — L601 Onycholysis: Secondary | ICD-10-CM | POA: Diagnosis not present

## 2021-07-01 DIAGNOSIS — M79674 Pain in right toe(s): Secondary | ICD-10-CM | POA: Diagnosis not present

## 2021-07-01 NOTE — Progress Notes (Signed)
Subjective: ?Patricia Estrada is a 46 y.o. female patient seen today in office with complaint of mildly painful thickened and discolored nails. Patient is desiring treatment for nail changes; has tried OTC topicals/Medication in the past with no improvement last 5-6 months. Reports that nails are becoming difficult to manage because of the thickness at right 1st and 4th toenails. Patient has no other pedal complaints at this time.  ? ?Patient Active Problem List  ? Diagnosis Date Noted  ? Essential hypertension 10/18/2017  ? Type 2 diabetes mellitus without complication, without long-term current use of insulin (HCC) 09/29/2017  ? Class 1 obesity with serious comorbidity and body mass index (BMI) of 31.0 to 31.9 in adult 09/29/2017  ? ? ?Current Outpatient Medications on File Prior to Visit  ?Medication Sig Dispense Refill  ? amLODipine-benazepril (LOTREL) 10-20 MG capsule Take 1 capsule by mouth daily. 90 capsule 1  ? atorvastatin (LIPITOR) 20 MG tablet Take 1 tablet (20 mg total) by mouth daily. 90 tablet 0  ? glucose blood test strip Use as instructed 100 each 12  ? levonorgestrel (MIRENA) 20 MCG/24HR IUD by Intrauterine route.    ? tirzepatide (MOUNJARO) 10 MG/0.5ML Pen Inject 10 mg into the skin once a week. 6 mL 1  ? triamcinolone cream (KENALOG) 0.5 % Apply 1 application topically 3 (three) times daily. 30 g 2  ? ?No current facility-administered medications on file prior to visit.  ? ? ?Allergies  ?Allergen Reactions  ? Penicillins Other (See Comments)  ?  childhood ?childhood  ? ? ?Objective: ?Physical Exam ? ?General: Well developed, nourished, no acute distress, awake, alert and oriented x 3 ? ?Vascular: Dorsalis pedis artery 2/4 bilateral, Posterior tibial artery 2/4 bilateral, skin temperature warm to warm proximal to distal bilateral lower extremities, no varicosities, pedal hair present bilateral. ? ?Neurological: Gross sensation present via light touch bilateral.  ? ?Dermatological: Skin is  warm, dry, and supple bilateral, Right 1st and 4th toenails are tender, short thick, and discolored with mild subungal debris, no webspace macerations present bilateral, no open lesions present bilateral, no callus/corns/hyperkeratotic tissue present bilateral. No signs of infection bilateral. ? ?Musculoskeletal: No boney deformities noted bilateral. Muscular strength within normal limits without painon range of motion. No pain with calf compression bilateral. ? ?Assessment and Plan:  ?Problem List Items Addressed This Visit   ? ?  ? Endocrine  ? Type 2 diabetes mellitus without complication, without long-term current use of insulin (HCC)  ? ?Other Visit Diagnoses   ? ? Fungal nail infection    -  Primary  ? Toe pain, right      ? ?  ? ? ?-Examined patient ?-Discussed treatment options for painful dystrophic nails  ?-Fungal culture was obtained by removing a portion of the hard nail itself from each of the involved right 1st and 5th toenails using a sterile nail nipper and sent to [facility] lab. Patient tolerated the biopsy procedure well without discomfort or need for anesthesia.  ?-Patient to return in 3-4 weeks for follow up evaluation and discussion of fungal culture results or sooner if symptoms worsen. ? ?Asencion Islam, DPM ? ?

## 2021-07-15 ENCOUNTER — Encounter: Payer: Self-pay | Admitting: Sports Medicine

## 2021-07-19 ENCOUNTER — Other Ambulatory Visit (HOSPITAL_COMMUNITY): Payer: Self-pay

## 2021-07-20 ENCOUNTER — Encounter: Payer: Self-pay | Admitting: Registered Nurse

## 2021-07-21 ENCOUNTER — Other Ambulatory Visit (HOSPITAL_COMMUNITY): Payer: Self-pay

## 2021-07-22 ENCOUNTER — Ambulatory Visit (INDEPENDENT_AMBULATORY_CARE_PROVIDER_SITE_OTHER): Payer: 59 | Admitting: Sports Medicine

## 2021-07-22 ENCOUNTER — Encounter: Payer: Self-pay | Admitting: Sports Medicine

## 2021-07-22 DIAGNOSIS — B351 Tinea unguium: Secondary | ICD-10-CM | POA: Diagnosis not present

## 2021-07-22 DIAGNOSIS — E119 Type 2 diabetes mellitus without complications: Secondary | ICD-10-CM | POA: Diagnosis not present

## 2021-07-22 DIAGNOSIS — M79674 Pain in right toe(s): Secondary | ICD-10-CM | POA: Diagnosis not present

## 2021-07-22 NOTE — Telephone Encounter (Signed)
I will get started on this PA in covermymeds. ?

## 2021-07-22 NOTE — Progress Notes (Addendum)
Subjective: ?Patricia Estrada is a 46 y.o. female patient seen today in office for fungal culture results. Patient has no other pedal complaints at this time.  ? ?Patient Active Problem List  ? Diagnosis Date Noted  ? Essential hypertension 10/18/2017  ? Type 2 diabetes mellitus without complication, without long-term current use of insulin (HCC) 09/29/2017  ? Class 1 obesity with serious comorbidity and body mass index (BMI) of 31.0 to 31.9 in adult 09/29/2017  ? ? ?Current Outpatient Medications on File Prior to Visit  ?Medication Sig Dispense Refill  ? amLODipine-benazepril (LOTREL) 10-20 MG capsule Take 1 capsule by mouth daily. 90 capsule 1  ? atorvastatin (LIPITOR) 20 MG tablet Take 1 tablet (20 mg total) by mouth daily. 90 tablet 0  ? glucose blood test strip Use as instructed 100 each 12  ? levonorgestrel (MIRENA) 20 MCG/24HR IUD by Intrauterine route.    ? tirzepatide (MOUNJARO) 10 MG/0.5ML Pen Inject 10 mg into the skin once a week. 6 mL 1  ? triamcinolone cream (KENALOG) 0.5 % Apply 1 application topically 3 (three) times daily. 30 g 2  ? WEGOVY 0.25 MG/0.5ML SOAJ     ? ?No current facility-administered medications on file prior to visit.  ? ? ?Allergies  ?Allergen Reactions  ? Penicillins Other (See Comments)  ?  childhood ?childhood  ? ? ?Objective: ?Physical Exam ? ?General: Well developed, nourished, no acute distress, awake, alert and oriented x 3 ?No physical exam performed at today's visit patient's exam essentially unchanged from prior as copied below ?General: Well developed, nourished, no acute distress, awake, alert and oriented x 3 ?  ?Vascular: Dorsalis pedis artery 2/4 bilateral, Posterior tibial artery 2/4 bilateral, skin temperature warm to warm proximal to distal bilateral lower extremities, no varicosities, pedal hair present bilateral. ?  ?Neurological: Gross sensation present via light touch bilateral.  ?  ?Dermatological: Skin is warm, dry, and supple bilateral, Right 1st and  4th toenails are tender, short thick, and discolored with mild subungal debris, no webspace macerations present bilateral, no open lesions present bilateral, no callus/corns/hyperkeratotic tissue present bilateral. No signs of infection bilateral. ?  ?Musculoskeletal: No boney deformities noted bilateral. Muscular strength within normal limits without painon range of motion. No pain with calf compression bilateral. ?  ? ?Fungal culture positive for dermatophyte T rubrum ? ?Assessment and Plan:  ?Problem List Items Addressed This Visit   ? ?  ? Endocrine  ? Type 2 diabetes mellitus without complication, without long-term current use of insulin (HCC)  ? ?Other Visit Diagnoses   ? ? Fungal nail infection    -  Primary  ? Relevant Orders  ? Hepatic Function Panel  ? Toe pain, right      ? ?  ? ? ?-Examined patient ?-Discussed treatment options for painful mycotic nails ?-Patient opt for oral Lamisil with full understanding of medication risks; ordered LFTs for review if within normal limits will proceed with sending Rx to pharmacy for lamisil 250mg  PO daily. Anticipate 12 week course.  ?-Advised good hygiene habits ?-Patient to return in 4-6 weeks for follow up evaluation or sooner if symptoms worsen. ? ? , DPM  ?

## 2021-07-23 DIAGNOSIS — B351 Tinea unguium: Secondary | ICD-10-CM | POA: Diagnosis not present

## 2021-07-24 ENCOUNTER — Other Ambulatory Visit: Payer: Self-pay | Admitting: Sports Medicine

## 2021-07-24 ENCOUNTER — Other Ambulatory Visit (HOSPITAL_COMMUNITY): Payer: Self-pay

## 2021-07-24 LAB — HEPATIC FUNCTION PANEL
AG Ratio: 1.6 (calc) (ref 1.0–2.5)
ALT: 27 U/L (ref 6–29)
AST: 19 U/L (ref 10–35)
Albumin: 4.1 g/dL (ref 3.6–5.1)
Alkaline phosphatase (APISO): 69 U/L (ref 31–125)
Bilirubin, Direct: 0.1 mg/dL (ref 0.0–0.2)
Globulin: 2.5 g/dL (calc) (ref 1.9–3.7)
Indirect Bilirubin: 0.3 mg/dL (calc) (ref 0.2–1.2)
Total Bilirubin: 0.4 mg/dL (ref 0.2–1.2)
Total Protein: 6.6 g/dL (ref 6.1–8.1)

## 2021-07-24 MED ORDER — TERBINAFINE HCL 250 MG PO TABS
250.0000 mg | ORAL_TABLET | Freq: Every day | ORAL | 0 refills | Status: DC
  Filled 2021-07-24: qty 90, 90d supply, fill #0

## 2021-07-24 NOTE — Progress Notes (Signed)
Liver panel normal. Oral Lamisil sent to pharmacy ?

## 2021-07-26 ENCOUNTER — Other Ambulatory Visit (HOSPITAL_COMMUNITY): Payer: Self-pay

## 2021-07-28 ENCOUNTER — Telehealth: Payer: Self-pay | Admitting: Registered Nurse

## 2021-07-28 DIAGNOSIS — H5213 Myopia, bilateral: Secondary | ICD-10-CM | POA: Diagnosis not present

## 2021-07-28 NOTE — Telephone Encounter (Signed)
Pt called in stating that the [facility] needs a PA, she wanted to know if this has been started? ? ?Please be advised.  ?

## 2021-07-29 NOTE — Telephone Encounter (Signed)
Patient PA has been started and completed ?

## 2021-08-18 ENCOUNTER — Other Ambulatory Visit (HOSPITAL_COMMUNITY): Payer: Self-pay

## 2021-08-26 ENCOUNTER — Ambulatory Visit: Payer: 59 | Admitting: Sports Medicine

## 2021-09-07 ENCOUNTER — Encounter: Payer: Self-pay | Admitting: Registered Nurse

## 2021-09-09 ENCOUNTER — Other Ambulatory Visit (HOSPITAL_COMMUNITY): Payer: Self-pay

## 2021-10-18 ENCOUNTER — Ambulatory Visit: Payer: 59 | Admitting: Registered Nurse

## 2021-10-18 ENCOUNTER — Encounter: Payer: Self-pay | Admitting: Registered Nurse

## 2021-10-18 ENCOUNTER — Other Ambulatory Visit (HOSPITAL_COMMUNITY): Payer: Self-pay

## 2021-10-18 VITALS — BP 122/80 | HR 79 | Temp 98.0°F | Resp 16 | Ht 69.0 in | Wt 176.0 lb

## 2021-10-18 DIAGNOSIS — E119 Type 2 diabetes mellitus without complications: Secondary | ICD-10-CM | POA: Diagnosis not present

## 2021-10-18 DIAGNOSIS — E785 Hyperlipidemia, unspecified: Secondary | ICD-10-CM

## 2021-10-18 DIAGNOSIS — I10 Essential (primary) hypertension: Secondary | ICD-10-CM | POA: Diagnosis not present

## 2021-10-18 DIAGNOSIS — L309 Dermatitis, unspecified: Secondary | ICD-10-CM | POA: Diagnosis not present

## 2021-10-18 LAB — CBC WITH DIFFERENTIAL/PLATELET
Basophils Absolute: 0 10*3/uL (ref 0.0–0.1)
Basophils Relative: 0.3 % (ref 0.0–3.0)
Eosinophils Absolute: 0.1 10*3/uL (ref 0.0–0.7)
Eosinophils Relative: 1.7 % (ref 0.0–5.0)
HCT: 39 % (ref 36.0–46.0)
Hemoglobin: 13 g/dL (ref 12.0–15.0)
Lymphocytes Relative: 32.6 % (ref 12.0–46.0)
Lymphs Abs: 1.6 10*3/uL (ref 0.7–4.0)
MCHC: 33.3 g/dL (ref 30.0–36.0)
MCV: 94.4 fl (ref 78.0–100.0)
Monocytes Absolute: 0.3 10*3/uL (ref 0.1–1.0)
Monocytes Relative: 6.3 % (ref 3.0–12.0)
Neutro Abs: 2.9 10*3/uL (ref 1.4–7.7)
Neutrophils Relative %: 59.1 % (ref 43.0–77.0)
Platelets: 242 10*3/uL (ref 150.0–400.0)
RBC: 4.13 Mil/uL (ref 3.87–5.11)
RDW: 12.7 % (ref 11.5–15.5)
WBC: 4.8 10*3/uL (ref 4.0–10.5)

## 2021-10-18 LAB — COMPREHENSIVE METABOLIC PANEL
ALT: 18 U/L (ref 0–35)
AST: 18 U/L (ref 0–37)
Albumin: 4.2 g/dL (ref 3.5–5.2)
Alkaline Phosphatase: 70 U/L (ref 39–117)
BUN: 14 mg/dL (ref 6–23)
CO2: 26 mEq/L (ref 19–32)
Calcium: 9.3 mg/dL (ref 8.4–10.5)
Chloride: 107 mEq/L (ref 96–112)
Creatinine, Ser: 0.86 mg/dL (ref 0.40–1.20)
GFR: 81.56 mL/min (ref >60.00)
Glucose, Bld: 80 mg/dL (ref 70–99)
Potassium: 4.3 mEq/L (ref 3.5–5.1)
Sodium: 138 mEq/L (ref 135–145)
Total Bilirubin: 0.4 mg/dL (ref 0.2–1.2)
Total Protein: 7.2 g/dL (ref 6.0–8.3)

## 2021-10-18 LAB — LIPID PANEL
Cholesterol: 164 mg/dL (ref 0–200)
HDL: 46 mg/dL (ref >39.00)
LDL Cholesterol: 110 mg/dL — ABNORMAL HIGH (ref 0–99)
NonHDL: 117.76
Total CHOL/HDL Ratio: 4
Triglycerides: 39 mg/dL (ref 0.0–149.0)
VLDL: 7.8 mg/dL (ref 0.0–40.0)

## 2021-10-18 LAB — HEMOGLOBIN A1C: Hgb A1c MFr Bld: 4.5 % — ABNORMAL LOW (ref 4.6–6.5)

## 2021-10-18 LAB — MICROALBUMIN / CREATININE URINE RATIO
Creatinine,U: 235.1 mg/dL
Microalb Creat Ratio: 0.4 mg/g (ref 0.0–30.0)
Microalb, Ur: 0.9 mg/dL (ref 0.0–1.9)

## 2021-10-18 LAB — TSH: TSH: 2.36 u[IU]/mL (ref 0.35–5.50)

## 2021-10-18 MED ORDER — AMLODIPINE BESY-BENAZEPRIL HCL 10-20 MG PO CAPS
1.0000 | ORAL_CAPSULE | Freq: Every day | ORAL | 1 refills | Status: DC
  Filled 2021-10-18: qty 90, 90d supply, fill #0

## 2021-10-18 MED ORDER — ATORVASTATIN CALCIUM 20 MG PO TABS
20.0000 mg | ORAL_TABLET | Freq: Every day | ORAL | 0 refills | Status: DC
  Filled 2021-10-18: qty 90, 90d supply, fill #0

## 2021-10-18 MED ORDER — TIRZEPATIDE 10 MG/0.5ML ~~LOC~~ SOAJ
10.0000 mg | SUBCUTANEOUS | 1 refills | Status: DC
  Filled 2021-10-18: qty 6, 84d supply, fill #0

## 2021-10-18 MED ORDER — CLOBETASOL PROPIONATE 0.05 % EX CREA
1.0000 | TOPICAL_CREAM | Freq: Two times a day (BID) | CUTANEOUS | 11 refills | Status: AC
  Filled 2021-10-18: qty 30, 30d supply, fill #0
  Filled 2021-11-26: qty 30, 30d supply, fill #1
  Filled 2022-03-16: qty 30, 30d supply, fill #2
  Filled 2022-04-10: qty 30, 30d supply, fill #3
  Filled 2022-05-08: qty 30, 30d supply, fill #4
  Filled 2022-06-05: qty 30, 30d supply, fill #5
  Filled 2022-09-10: qty 30, 30d supply, fill #6

## 2021-10-18 NOTE — Assessment & Plan Note (Signed)
Well controlled. Continue regimen. Labs collected. Will follow up with the patient as warranted.  

## 2021-10-18 NOTE — Assessment & Plan Note (Signed)
Uncontrolled, will increase potency to clobetasol. Monitor effect. Discussed safe use of medication

## 2021-10-18 NOTE — Progress Notes (Signed)
Established Patient Office Visit  Subjective:  Patient ID: Patricia Estrada, female    DOB: [birth year]  Age: 46 y.o. MRN: [id number]  CC:  Chief Complaint  Patient presents with   Follow-up   Diabetes    F/U on diabetes doing well     HPI Patricia Estrada presents for t2dm, eczema  T2dm Last A1c:  Lab Results  Component Value Date   HGBA1C 4.8 04/21/2021    Currently taking: mounjaro 10mg  subq weekly. No new complications Reports good compliance with medications Diet has been improved Exercise habits have been steady.   Hypertension: Patient Currently taking: amlodipine-benazepril 10-20mg  po qd Good effect. No AEs. Denies CV symptoms including: chest pain, shob, doe, headache, visual changes, fatigue, claudication, and dependent edema.   Previous readings and labs: BP Readings from Last 3 Encounters:  10/18/21 122/80  04/21/21 (!) 146/73  01/19/21 (!) 160/78   Lab Results  Component Value Date   CREATININE 0.75 10/23/2018     Eczema Worst on L forearm Has used triamcinolone with minimal relief Has used clobetasol in past with better effect Would prefer to use this.  Outpatient Medications Prior to Visit  Medication Sig Dispense Refill   glucose blood test strip Use as instructed 100 each 12   levonorgestrel (MIRENA) 20 MCG/24HR IUD by Intrauterine route.     terbinafine (LAMISIL) 250 MG tablet Take 1 tablet by mouth daily. 90 tablet 0   triamcinolone cream (KENALOG) 0.5 % Apply 1 application topically 3 (three) times daily. 30 g 2   amLODipine-benazepril (LOTREL) 10-20 MG capsule Take 1 capsule by mouth daily. 90 capsule 1   atorvastatin (LIPITOR) 20 MG tablet Take 1 tablet (20 mg total) by mouth daily. 90 tablet 0   tirzepatide (MOUNJARO) 10 MG/0.5ML Pen Inject 10 mg into the skin once a week. 6 mL 1   WEGOVY 0.25 MG/0.5ML SOAJ      No facility-administered medications prior to visit.    Review of Systems  Constitutional: Negative.    HENT: Negative.    Eyes: Negative.   Respiratory: Negative.    Cardiovascular: Negative.   Gastrointestinal: Negative.   Genitourinary: Negative.   Musculoskeletal: Negative.   Skin: Negative.   Neurological: Negative.   Psychiatric/Behavioral: Negative.    All other systems reviewed and are negative.     Objective:     BP 122/80   Pulse 79   Temp 98 F (36.7 C) (Temporal)   Resp 16   Ht 5\' 9"  (1.753 m)   Wt 176 lb (79.8 kg)   SpO2 99%   BMI 25.99 kg/m   Wt Readings from Last 3 Encounters:  10/18/21 176 lb (79.8 kg)  04/21/21 196 lb 6.4 oz (89.1 kg)  01/19/21 216 lb (98 kg)   Physical Exam Vitals and nursing note reviewed.  Constitutional:      General: She is not in acute distress.    Appearance: Normal appearance. She is normal weight. She is not ill-appearing, toxic-appearing or diaphoretic.  Cardiovascular:     Rate and Rhythm: Normal rate and regular rhythm.     Heart sounds: Normal heart sounds. No murmur heard.    No friction rub. No gallop.  Pulmonary:     Effort: Pulmonary effort is normal. No respiratory distress.     Breath sounds: Normal breath sounds. No stridor. No wheezing, rhonchi or rales.  Chest:     Chest wall: No tenderness.  Skin:    General: Skin is warm and  dry.  Neurological:     General: No focal deficit present.     Mental Status: She is alert and oriented to person, place, and time. Mental status is at baseline.  Psychiatric:        Mood and Affect: Mood normal.        Behavior: Behavior normal.        Thought Content: Thought content normal.        Judgment: Judgment normal.     No results found for any visits on 10/18/21.    The 10-year ASCVD risk score (Arnett DK, et al., 2019) is: 7.8%    Assessment & Plan:   Problem List Items Addressed This Visit       Cardiovascular and Mediastinum   Essential hypertension    Well controlled. Continue regimen. Labs collected. Will follow up with the patient as warranted.        Relevant Medications   amLODipine-benazepril (LOTREL) 10-20 MG capsule   atorvastatin (LIPITOR) 20 MG tablet   Other Relevant Orders   TSH     Endocrine   Type 2 diabetes mellitus without complication, without long-term current use of insulin (HCC)    Well controlled as of last check. Recheck labs. Refill meds      Relevant Medications   amLODipine-benazepril (LOTREL) 10-20 MG capsule   atorvastatin (LIPITOR) 20 MG tablet   tirzepatide (MOUNJARO) 10 MG/0.5ML Pen   Other Relevant Orders   CBC with Differential/Platelet   Comprehensive metabolic panel   Hemoglobin A1c   Microalbumin / creatinine urine ratio     Musculoskeletal and Integument   Eczema - Primary    Uncontrolled, will increase potency to clobetasol. Monitor effect. Discussed safe use of medication      Relevant Medications   clobetasol cream (TEMOVATE) 0.05 %     Other   Hyperlipidemia    Continue statin. Labs collected. Will follow up with the patient as warranted.       Relevant Medications   amLODipine-benazepril (LOTREL) 10-20 MG capsule   atorvastatin (LIPITOR) 20 MG tablet   Other Relevant Orders   Comprehensive metabolic panel   Lipid panel   TSH    Meds ordered this encounter  Medications   clobetasol cream (TEMOVATE) 0.05 %    Sig: Apply 1 Application topically 2 (two) times daily.    Dispense:  30 g    Refill:  11    Order Specific Question:   Supervising Provider    Answer:   Neva Seat, JEFFREY R [[id number]]   amLODipine-benazepril (LOTREL) 10-20 MG capsule    Sig: Take 1 capsule by mouth daily.    Dispense:  90 capsule    Refill:  1    Order Specific Question:   Supervising Provider    Answer:   Neva Seat, JEFFREY R [[id number]]   atorvastatin (LIPITOR) 20 MG tablet    Sig: Take 1 tablet (20 mg total) by mouth daily.    Dispense:  90 tablet    Refill:  0    Order Specific Question:   Supervising Provider    Answer:   Neva Seat, JEFFREY R [[id number]]   tirzepatide (MOUNJARO) 10 MG/0.5ML Pen    Sig:  Inject 10 mg into the skin once a week.    Dispense:  6 mL    Refill:  1    Order Specific Question:   Supervising Provider    Answer:   Neva Seat, JEFFREY R [[id number]]    Return in about 6 months (around 04/20/2022)  for Chronic Conditions.    Janeece Agee, NP

## 2021-10-18 NOTE — Patient Instructions (Signed)
Ms. Gambrel -   It's been a pleasure participating in your care.  You're doing great. Keep it up!  I recommend these providers:  Jarold Motto, PA Jacquiline Doe, MD Edwina Barth, MD Letta Moynahan Early, NP Jiles Prows, DNP  See one of them in January. Call sooner if you need anything!  Thanks,  Luan Pulling

## 2021-10-18 NOTE — Assessment & Plan Note (Signed)
Continue statin. Labs collected. Will follow up with the patient as warranted.  

## 2021-10-18 NOTE — Assessment & Plan Note (Signed)
Well controlled as of last check. Recheck labs. Refill meds

## 2021-10-27 ENCOUNTER — Encounter: Payer: Self-pay | Admitting: Registered Nurse

## 2021-10-28 ENCOUNTER — Other Ambulatory Visit (HOSPITAL_COMMUNITY): Payer: Self-pay

## 2021-10-28 ENCOUNTER — Other Ambulatory Visit: Payer: Self-pay | Admitting: Registered Nurse

## 2021-10-28 DIAGNOSIS — E119 Type 2 diabetes mellitus without complications: Secondary | ICD-10-CM

## 2021-10-28 MED ORDER — TIRZEPATIDE 5 MG/0.5ML ~~LOC~~ SOAJ
5.0000 mg | SUBCUTANEOUS | 3 refills | Status: DC
  Filled 2021-10-28: qty 2, 28d supply, fill #0
  Filled 2021-11-26 – 2021-11-30 (×2): qty 2, 28d supply, fill #1
  Filled 2021-12-23: qty 2, 28d supply, fill #2

## 2021-10-28 NOTE — Progress Notes (Signed)
Reducing dose.   Jari Sportsman, NP

## 2021-11-03 ENCOUNTER — Other Ambulatory Visit (HOSPITAL_COMMUNITY): Payer: Self-pay

## 2021-11-16 ENCOUNTER — Ambulatory Visit (INDEPENDENT_AMBULATORY_CARE_PROVIDER_SITE_OTHER): Payer: 59 | Admitting: Obstetrics & Gynecology

## 2021-11-16 ENCOUNTER — Encounter: Payer: Self-pay | Admitting: Obstetrics & Gynecology

## 2021-11-16 ENCOUNTER — Other Ambulatory Visit (HOSPITAL_COMMUNITY)
Admission: RE | Admit: 2021-11-16 | Discharge: 2021-11-16 | Disposition: A | Discharge status: Home / Self Care | Payer: 59 | Source: Ambulatory Visit | Attending: Obstetrics & Gynecology | Admitting: Obstetrics & Gynecology

## 2021-11-16 VITALS — BP 148/87 | HR 79 | Ht 69.0 in | Wt 176.0 lb

## 2021-11-16 DIAGNOSIS — Z1231 Encounter for screening mammogram for malignant neoplasm of breast: Secondary | ICD-10-CM

## 2021-11-16 DIAGNOSIS — Z1211 Encounter for screening for malignant neoplasm of colon: Secondary | ICD-10-CM | POA: Diagnosis not present

## 2021-11-16 DIAGNOSIS — Z30433 Encounter for removal and reinsertion of intrauterine contraceptive device: Secondary | ICD-10-CM

## 2021-11-16 DIAGNOSIS — Z01419 Encounter for gynecological examination (general) (routine) without abnormal findings: Secondary | ICD-10-CM

## 2021-11-16 MED ORDER — LEVONORGESTREL 20 MCG/DAY IU IUD
1.0000 | INTRAUTERINE_SYSTEM | Freq: Once | INTRAUTERINE | Status: AC
  Administered 2021-11-16: 1 via INTRAUTERINE

## 2021-11-16 NOTE — Progress Notes (Signed)
GYNECOLOGY ANNUAL PREVENTATIVE CARE ENCOUNTER NOTE  History:     Patricia Estrada is a 46 y.o. G13P1001 female here for a routine annual gynecologic exam.  Current complaints: none.  Desires replacement of her Mirena, has been in place for 10 years. No issues with it, is amenorrheic with it.   Denies abnormal vaginal bleeding, discharge, pelvic pain, problems with intercourse or other gynecologic concerns.    Gynecologic History No LMP recorded. (Menstrual status: IUD). Contraception: IUD Last Pap: 11/13/2018. Result was normal with negative HPV Last Mammogram: Never had one Last Colonoscopy: Never had one  Obstetric History OB History  Gravida Para Term Preterm AB Living  1 1 1     1   SAB IAB Ectopic Multiple Live Births          1    # Outcome Date GA Lbr Len/2nd Weight Sex Delivery Anes PTL Lv  1 Term 08/23/04 [redacted]w[redacted]d   M Vag-Spont Other  LIV    Past Medical History:  Diagnosis Date   Eczema    Essential hypertension    Gestational diabetes    Type 2 diabetes mellitus (HCC)     Past Surgical History:  Procedure Laterality Date   EYE SURGERY     WISDOM TOOTH EXTRACTION      Current Outpatient Medications on File Prior to Visit  Medication Sig Dispense Refill   amLODipine-benazepril (LOTREL) 10-20 MG capsule Take 1 capsule by mouth daily. 90 capsule 1   atorvastatin (LIPITOR) 20 MG tablet Take 1 tablet (20 mg total) by mouth daily. 90 tablet 0   clobetasol cream (TEMOVATE) 0.05 % Apply 1 Application topically 2 (two) times daily. 30 g 11   glucose blood test strip Use as instructed 100 each 12   terbinafine (LAMISIL) 250 MG tablet Take 1 tablet by mouth daily. 90 tablet 0   tirzepatide (MOUNJARO) 5 MG/0.5ML Pen Inject 1 pen (5 mg) into the skin once a week. 6 mL 3   No current facility-administered medications on file prior to visit.    Allergies  Allergen Reactions   Penicillins Other (See Comments)    childhood childhood    Social History:  reports  that she has never smoked. She has never used smokeless tobacco. She reports that she does not drink alcohol and does not use drugs.  Family History  Problem Relation Age of Onset   Diabetes Mother    Cancer Mother     The following portions of the patient's history were reviewed and updated as appropriate: allergies, current medications, past family history, past medical history, past social history, past surgical history and problem list.  Review of Systems Pertinent items noted in HPI and remainder of comprehensive ROS otherwise negative.  Physical Exam:  BP (!) 148/87   Pulse 79   Ht 5\' 9"  (1.753 m)   Wt 176 lb (79.8 kg)   BMI 25.99 kg/m  CONSTITUTIONAL: Well-developed, well-nourished female in no acute distress.  HENT:  Normocephalic, atraumatic, External right and left ear normal.  EYES: Conjunctivae and EOM are normal. Pupils are equal, round, and reactive to light. No scleral icterus.  NECK: Normal range of motion, supple, no masses.  Normal thyroid.  SKIN: Skin is warm and dry. No rash noted. Not diaphoretic. No erythema. No pallor. MUSCULOSKELETAL: Normal range of motion. No tenderness.  No cyanosis, clubbing, or edema. NEUROLOGIC: Alert and oriented to person, place, and time. Normal reflexes, muscle tone coordination.  PSYCHIATRIC: Normal mood and affect. Normal  behavior. Normal judgment and thought content. CARDIOVASCULAR: Normal heart rate noted, regular rhythm RESPIRATORY: Clear to auscultation bilaterally. Effort and breath sounds normal, no problems with respiration noted. BREASTS: Symmetric in size. No masses, tenderness, skin changes, nipple drainage, or lymphadenopathy bilaterally. Performed in the presence of a chaperone. ABDOMEN: Soft, no distention noted.  No tenderness, rebound or guarding.  PELVIC: Normal appearing external genitalia and urethral meatus; normal appearing vaginal mucosa and cervix.  No abnormal vaginal discharge noted.  Pap smear obtained.   Normal uterine size, no other palpable masses, no uterine or adnexal tenderness.  Performed in the presence of a chaperone.  IUD Removal and Reinsertion  Patient identified, informed consent performed, consent signed.   Discussed risks of irregular bleeding, cramping, infection, malpositioning or misplacement of the IUD outside the uterus which may require further procedures. Also discussed >99% contraception efficacy, increased risk of ectopic pregnancy with failure of method.   Emphasized that this did not protect against STIs, condoms recommended during all sexual encounters. Advised to use backup contraception for one week as the risk of pregnancy is higher during the transition period of removing an IUD and replacing it with another one. Time out was performed. Speculum placed in the vagina. The strings of the old Mirena IUD were grasped and pulled using ring forceps. The IUD was successfully removed in its entirety. The cervix was cleaned with Betadine x 2 and grasped anteriorly with a single tooth tenaculum.  The new Mirena IUD insertion apparatus was used to sound the uterus to 7 cm;  the IUD was then placed per manufacturer's recommendations. Strings trimmed to 3 cm. Tenaculum was removed, good hemostasis noted. Patient tolerated procedure well.    Assessment and Plan:     1. Breast cancer screening by mammogram Mammogram scheduled - MM 3D SCREEN BREAST BILATERAL; Future  2. Encounter for screening colonoscopy Discussed need for colon cancer screening over the age 67. Colonoscopy recommended as this is the gold standard.  Patient agreed. Referral made to Gastroenterology for colonoscopy - Ambulatory referral to Gastroenterology  3. Encounter for removal and reinsertion of Mirena IUD - levonorgestrel (MIRENA) 20 MCG/DAY IUD 1 each Patient was given post-procedure instructions.  She was reminded to have backup contraception for one week during this transition period between IUDs.  Patient was  also asked to check IUD strings periodically and follow up in 4 weeks for IUD check.  4. Well woman exam with routine gynecological exam - Cytology - PAP Will follow up results of pap smear and manage accordingly. Routine preventative health maintenance measures emphasized. Please refer to After Visit Summary for other counseling recommendations.      Jaynie Collins, MD, FACOG Obstetrician & Gynecologist, [facility] for [employer], [facility]

## 2021-11-18 ENCOUNTER — Encounter: Payer: Self-pay | Admitting: Obstetrics & Gynecology

## 2021-11-18 DIAGNOSIS — R87810 Cervical high risk human papillomavirus (HPV) DNA test positive: Secondary | ICD-10-CM | POA: Insufficient documentation

## 2021-11-18 LAB — CYTOLOGY - PAP
Comment: NEGATIVE
Comment: NEGATIVE
Comment: NEGATIVE
Diagnosis: NEGATIVE
HPV 16: NEGATIVE
HPV 18 / 45: NEGATIVE
High risk HPV: POSITIVE — AB

## 2021-11-30 ENCOUNTER — Other Ambulatory Visit (HOSPITAL_COMMUNITY): Payer: Self-pay

## 2021-12-24 ENCOUNTER — Other Ambulatory Visit (HOSPITAL_COMMUNITY): Payer: Self-pay

## 2021-12-25 ENCOUNTER — Other Ambulatory Visit (HOSPITAL_COMMUNITY): Payer: Self-pay

## 2021-12-27 ENCOUNTER — Ambulatory Visit (INDEPENDENT_AMBULATORY_CARE_PROVIDER_SITE_OTHER): Payer: 59 | Admitting: Family Medicine

## 2021-12-27 ENCOUNTER — Other Ambulatory Visit (HOSPITAL_COMMUNITY): Payer: Self-pay

## 2021-12-27 ENCOUNTER — Encounter: Payer: Self-pay | Admitting: Family Medicine

## 2021-12-27 VITALS — BP 149/85 | HR 84 | Wt 184.0 lb

## 2021-12-27 DIAGNOSIS — Z30431 Encounter for routine checking of intrauterine contraceptive device: Secondary | ICD-10-CM | POA: Diagnosis not present

## 2021-12-27 DIAGNOSIS — Z23 Encounter for immunization: Secondary | ICD-10-CM | POA: Diagnosis not present

## 2021-12-27 NOTE — Progress Notes (Unsigned)
RGYN patient here for IUD String check. Inserted 11/16/21.  LJ:QGBEEFE wants to discuss recent pap results and HPV vaccine.    Pt ok with student being present.

## 2021-12-27 NOTE — Progress Notes (Unsigned)
    GYNECOLOGY CLINIC- IUD STRING CHECK PROGRESS NOTE  History:  46 y.o. G1P1001 here today for today for IUD string check; Mirena was placed  11/16/2021. No complaints about the Mirena, no concerning side effects.  The following portions of the patient's history were reviewed and updated as appropriate: allergies, current medications, past family history, past medical history, past social history, past surgical history and problem list. Last pap smear on 11/16/21 was normal, POS HRHPV.  Review of Systems:  Pertinent items are noted in HPI.   Objective:  Physical Exam Blood pressure (!) 149/85, pulse 84, weight 184 lb (83.5 kg). Gen: NAD Abd: Soft, nontender and nondistended Pelvic: Normal appearing external genitalia; normal appearing vaginal mucosa and cervix.  IUD strings visualized, about 2 cm in length outside cervix.   Assessment & Plan:  Normal IUD check. Patient to keep IUD in place for five years; can come in for removal if she desires pregnancy within the next five years. Routine preventative health maintenance measures emphasized  1. Need for HPV vaccination - HPV 9-valent vaccine,Recombinat Reviewed pap results with patient, recommend repeat in 1 year  2. IUD check up Strings in place  Caren Macadam, [place] for [employer], [place]

## 2021-12-30 ENCOUNTER — Encounter: Payer: Self-pay | Admitting: Family Medicine

## 2022-01-19 ENCOUNTER — Other Ambulatory Visit: Payer: Self-pay | Admitting: Registered Nurse

## 2022-01-19 ENCOUNTER — Other Ambulatory Visit (HOSPITAL_COMMUNITY): Payer: Self-pay

## 2022-01-19 DIAGNOSIS — E119 Type 2 diabetes mellitus without complications: Secondary | ICD-10-CM

## 2022-01-19 MED ORDER — TIRZEPATIDE 5 MG/0.5ML ~~LOC~~ SOAJ
5.0000 mg | SUBCUTANEOUS | 3 refills | Status: DC
  Filled 2022-01-19: qty 2, 28d supply, fill #0
  Filled 2022-02-18: qty 2, 28d supply, fill #1
  Filled 2022-03-16: qty 2, 28d supply, fill #2
  Filled 2022-04-10 – 2022-04-13 (×4): qty 2, 28d supply, fill #3
  Filled 2022-05-08: qty 2, 28d supply, fill #4
  Filled 2022-06-05: qty 2, 28d supply, fill #5
  Filled 2022-07-03: qty 2, 28d supply, fill #6
  Filled 2022-08-02: qty 2, 28d supply, fill #7
  Filled 2022-09-10: qty 2, 28d supply, fill #8
  Filled 2022-10-26: qty 2, 28d supply, fill #9
  Filled 2022-11-21: qty 2, 28d supply, fill #10
  Filled 2022-12-19: qty 2, 28d supply, fill #11

## 2022-01-19 NOTE — Telephone Encounter (Signed)
Patient is requesting a refill of the following medications: Requested Prescriptions   Pending Prescriptions Disp Refills   tirzepatide (MOUNJARO) 5 MG/0.5ML Pen 6 mL 3    Sig: Inject 1 pen (5 mg) into the skin once a week.    Date of patient request: 01/19/22 Last office visit: 10/18/21 Date of last refill: 10/28/21 Last refill amount: 109mL  Patient is aware needs new PCP and does have appt coming up in November

## 2022-01-19 NOTE — Telephone Encounter (Signed)
Pt has been advised.

## 2022-01-19 NOTE — Telephone Encounter (Signed)
Encourage patient to contact the pharmacy for refills or they can request refills through [facility]  (Please schedule appointment if patient has not been seen in over a year)    WHAT [place] TO: [place] [phone]  MEDICATION NAME & DOSE: tirzepatide 5 mg  NOTES/COMMENTS FROM PATIENT: pt is requesting 90 day supply; is aware that she needs to establish with a new PCP      [place] office please notify patient: It takes 48-72 hours to process rx refill requests Ask patient to call pharmacy to ensure rx is ready before heading there.

## 2022-01-20 ENCOUNTER — Other Ambulatory Visit (HOSPITAL_COMMUNITY): Payer: Self-pay

## 2022-02-10 ENCOUNTER — Ambulatory Visit: Payer: 59 | Admitting: Nurse Practitioner

## 2022-02-10 ENCOUNTER — Other Ambulatory Visit (HOSPITAL_COMMUNITY): Payer: Self-pay

## 2022-02-10 ENCOUNTER — Encounter: Payer: Self-pay | Admitting: Nurse Practitioner

## 2022-02-10 VITALS — BP 126/80 | HR 88 | Temp 96.9°F | Ht 69.0 in | Wt 180.0 lb

## 2022-02-10 DIAGNOSIS — I10 Essential (primary) hypertension: Secondary | ICD-10-CM

## 2022-02-10 DIAGNOSIS — E1165 Type 2 diabetes mellitus with hyperglycemia: Secondary | ICD-10-CM | POA: Diagnosis not present

## 2022-02-10 DIAGNOSIS — E785 Hyperlipidemia, unspecified: Secondary | ICD-10-CM | POA: Diagnosis not present

## 2022-02-10 DIAGNOSIS — R110 Nausea: Secondary | ICD-10-CM | POA: Diagnosis not present

## 2022-02-10 LAB — POCT GLYCOSYLATED HEMOGLOBIN (HGB A1C): Hemoglobin A1C: 4.6 % (ref 4.0–5.6)

## 2022-02-10 MED ORDER — ATORVASTATIN CALCIUM 20 MG PO TABS
20.0000 mg | ORAL_TABLET | Freq: Every day | ORAL | 1 refills | Status: DC
  Filled 2022-02-10: qty 90, 90d supply, fill #0

## 2022-02-10 MED ORDER — AMLODIPINE BESY-BENAZEPRIL HCL 10-20 MG PO CAPS
1.0000 | ORAL_CAPSULE | Freq: Every day | ORAL | 1 refills | Status: DC
  Filled 2022-02-10: qty 90, 90d supply, fill #0

## 2022-02-10 MED ORDER — ONDANSETRON HCL 4 MG PO TABS
4.0000 mg | ORAL_TABLET | Freq: Three times a day (TID) | ORAL | 0 refills | Status: AC | PRN
  Filled 2022-02-10: qty 30, 10d supply, fill #0

## 2022-02-10 NOTE — Assessment & Plan Note (Addendum)
Use of mounjaro x 1year Last hgbA1c of 4.5% Dose was decreased from 10 to 5mg  35months ago. Reports daily nausea and fatigue, no ABD pain, no GERD, no constipation/diarrhea.  Repeat hgbA1c: 4.6% Maintain mounjaro dose Sent zofran for nausea Check ABD [place] Consider hold mounjaro is no improvement. F/up in 49months

## 2022-02-10 NOTE — Progress Notes (Signed)
Established Patient Visit  Patient: Patricia Estrada   DOB: 30-Nov-1975   46 y.o. Female  MRN: [id number] Visit Date: 02/10/2022  Subjective:    Chief Complaint  Patient presents with   Establish Care    New pt, est care Mounjaro side effects    HPI Type 2 diabetes mellitus without complication, without long-term current use of insulin (HCC) Use of mounjaro x 1year Last hgbA1c of 4.5% Dose was decreased from 10 to 5mg  43months ago. Reports daily nausea and fatigue, no ABD pain, no GERD, no constipation/diarrhea.  Repeat hgbA1c: 4.6% Maintain mounjaro dose Sent zofran for nausea Check ABD [place] Consider hold mounjaro is no improvement. F/up in 4months   Essential hypertension BP at goal with amlodipine and benazepril BP Readings from Last 3 Encounters:  02/10/22 126/80  12/27/21 (!) 149/85  11/16/21 (!) 148/87    Maintain med doses Use of mounjaro x 1year Decreased dose from  1BM every 2-3days. Nasuea and aftigue No bloating  Wt Readings from Last 3 Encounters:  02/10/22 180 lb (81.6 kg)  12/27/21 184 lb (83.5 kg)  11/16/21 176 lb (79.8 kg)    Reviewed medical, surgical, and social history today  Medications: Outpatient Medications Prior to Visit  Medication Sig   clobetasol cream (TEMOVATE) 9.38 % Apply 1 Application topically 2 (two) times daily.   glucose blood test strip Use as instructed   levonorgestrel (MIRENA) 20 MCG/DAY IUD 1 each by Intrauterine route once.   tirzepatide [facility]) 5 MG/0.5ML Pen Inject 1 pen (5 mg) into the skin once a week.   [DISCONTINUED] amLODipine-benazepril (LOTREL) 10-20 MG capsule Take 1 capsule by mouth daily.   [DISCONTINUED] atorvastatin (LIPITOR) 20 MG tablet Take 1 tablet (20 mg total) by mouth daily.   [DISCONTINUED] terbinafine (LAMISIL) 250 MG tablet Take 1 tablet by mouth daily. (Patient not taking: Reported on 02/10/2022)   No facility-administered medications prior to visit.   Reviewed past  medical and social history.   ROS per HPI above  Last CBC Lab Results  Component Value Date   WBC 4.8 10/18/2021   HGB 13.0 10/18/2021   HCT 39.0 10/18/2021   MCV 94.4 10/18/2021   MCH 29.9 10/23/2018   RDW 12.7 10/18/2021   PLT 242.0 01/25/5101   Last metabolic panel Lab Results  Component Value Date   GLUCOSE 80 10/18/2021   NA 138 10/18/2021   K 4.3 10/18/2021   CL 107 10/18/2021   CO2 26 10/18/2021   BUN 14 10/18/2021   CREATININE 0.86 10/18/2021   GFRNONAA 99 10/23/2018   CALCIUM 9.3 10/18/2021   PROT 7.2 10/18/2021   ALBUMIN 4.2 10/18/2021   LABGLOB 2.6 10/23/2018   AGRATIO 1.7 10/23/2018   BILITOT 0.4 10/18/2021   ALKPHOS 70 10/18/2021   AST 18 10/18/2021   ALT 18 10/18/2021   Last lipids Lab Results  Component Value Date   CHOL 164 10/18/2021   HDL 46.00 10/18/2021   LDLCALC 110 (H) 10/18/2021   TRIG 39.0 10/18/2021   CHOLHDL 4 10/18/2021   Last hemoglobin A1c Lab Results  Component Value Date   HGBA1C 4.6 02/10/2022   Last thyroid functions Lab Results  Component Value Date   TSH 2.36 10/18/2021        Objective:  BP 126/80 (BP Location: Right Arm, Patient Position: Sitting, Cuff Size: Normal)   Pulse 88   Temp (!) 96.9 F (36.1 C) (Temporal)  Ht 5\' 9"  (1.753 m)   Wt 180 lb (81.6 kg)   SpO2 99%   BMI 26.58 kg/m      Physical Exam Vitals reviewed.  Cardiovascular:     Rate and Rhythm: Normal rate and regular rhythm.     Pulses: Normal pulses.     Heart sounds: Normal heart sounds.  Pulmonary:     Effort: Pulmonary effort is normal.     Breath sounds: Normal breath sounds.  Abdominal:     General: There is no distension.     Palpations: Abdomen is soft.     Tenderness: There is no abdominal tenderness. There is no right CVA tenderness, left CVA tenderness or guarding.  Neurological:     Mental Status: She is alert and oriented to person, place, and time.     Results for orders placed or performed in visit on 02/10/22   POCT glycosylated hemoglobin (Hb A1C)  Result Value Ref Range   Hemoglobin A1C 4.6 4.0 - 5.6 %      Assessment & Plan:    Problem List Items Addressed This Visit       Cardiovascular and Mediastinum   Essential hypertension    BP at goal with amlodipine and benazepril BP Readings from Last 3 Encounters:  02/10/22 126/80  12/27/21 (!) 149/85  11/16/21 (!) 148/87    Maintain med doses      Relevant Medications   amLODipine-benazepril (LOTREL) 10-20 MG capsule   atorvastatin (LIPITOR) 20 MG tablet     Endocrine   Type 2 diabetes mellitus without complication, without long-term current use of insulin (HCC) - Primary    Use of mounjaro x 1year Last hgbA1c of 4.5% Dose was decreased from 10 to 5mg  76months ago. Reports daily nausea and fatigue, no ABD pain, no GERD, no constipation/diarrhea.  Repeat hgbA1c: 4.6% Maintain mounjaro dose Sent zofran for nausea Check ABD [place] Consider hold mounjaro is no improvement. F/up in 26months       Relevant Medications   amLODipine-benazepril (LOTREL) 10-20 MG capsule   atorvastatin (LIPITOR) 20 MG tablet     Other   Hyperlipidemia   Relevant Medications   amLODipine-benazepril (LOTREL) 10-20 MG capsule   atorvastatin (LIPITOR) 20 MG tablet   Other Visit Diagnoses     Nausea       Relevant Medications   ondansetron (ZOFRAN) 4 MG tablet   Other Relevant Orders   US Abdomen Limited RUQ (LIVER/GB)      Return in about 3 months (around 05/13/2022) for DM, HTN, hyperlipidemia (fasting).     Wilfred Lacy, NP

## 2022-02-10 NOTE — Assessment & Plan Note (Signed)
BP at goal with amlodipine and benazepril BP Readings from Last 3 Encounters:  02/10/22 126/80  12/27/21 (!) 149/85  11/16/21 (!) 148/87    Maintain med doses

## 2022-02-10 NOTE — Patient Instructions (Addendum)
Schedule appt for annual eye exam Maintain Mounjaro dose at 5mg  Use zofran for nausea You will be contacted to schedule appt for ABD [place] If nausea does not improve, hold mounjaro

## 2022-02-18 ENCOUNTER — Other Ambulatory Visit (HOSPITAL_COMMUNITY): Payer: Self-pay

## 2022-02-28 ENCOUNTER — Ambulatory Visit (INDEPENDENT_AMBULATORY_CARE_PROVIDER_SITE_OTHER): Payer: 59

## 2022-02-28 DIAGNOSIS — Z23 Encounter for immunization: Secondary | ICD-10-CM

## 2022-02-28 NOTE — Progress Notes (Signed)
Patricia Estrada is here for their 2nd Gardasil injection. Pt denies any issues at this time. Pt tolerated injection well. To follow up in 4 months for next injection.    First injection was 12/27/21.  Dose 1 : First shot Dose 2 : Second shot given 2 months after the first shot  Dose 3 : Third shot given 6 months after the first shot

## 2022-03-16 ENCOUNTER — Other Ambulatory Visit (HOSPITAL_COMMUNITY): Payer: Self-pay

## 2022-04-11 ENCOUNTER — Other Ambulatory Visit: Payer: Self-pay

## 2022-04-12 ENCOUNTER — Other Ambulatory Visit (HOSPITAL_COMMUNITY): Payer: Self-pay

## 2022-04-15 ENCOUNTER — Other Ambulatory Visit (HOSPITAL_COMMUNITY): Payer: Self-pay

## 2022-05-09 ENCOUNTER — Other Ambulatory Visit (HOSPITAL_COMMUNITY): Payer: Self-pay

## 2022-05-13 ENCOUNTER — Ambulatory Visit: Payer: 59 | Admitting: Nurse Practitioner

## 2022-06-22 ENCOUNTER — Other Ambulatory Visit: Payer: Self-pay

## 2022-07-06 ENCOUNTER — Other Ambulatory Visit (HOSPITAL_COMMUNITY): Payer: Self-pay

## 2022-08-02 ENCOUNTER — Other Ambulatory Visit: Payer: Self-pay

## 2022-09-16 ENCOUNTER — Other Ambulatory Visit: Payer: Self-pay

## 2022-09-19 ENCOUNTER — Other Ambulatory Visit (HOSPITAL_COMMUNITY): Payer: Self-pay

## 2022-11-28 ENCOUNTER — Ambulatory Visit: Payer: 59 | Admitting: Family Medicine

## 2022-11-28 VITALS — BP 135/79 | HR 72 | Temp 98.1°F | Resp 18 | Ht 69.0 in | Wt 195.2 lb

## 2022-11-28 DIAGNOSIS — E782 Mixed hyperlipidemia: Secondary | ICD-10-CM | POA: Diagnosis not present

## 2022-11-28 DIAGNOSIS — E1165 Type 2 diabetes mellitus with hyperglycemia: Secondary | ICD-10-CM

## 2022-11-28 DIAGNOSIS — Z7689 Persons encountering health services in other specified circumstances: Secondary | ICD-10-CM

## 2022-11-28 DIAGNOSIS — L2082 Flexural eczema: Secondary | ICD-10-CM | POA: Diagnosis not present

## 2022-11-28 DIAGNOSIS — Z7984 Long term (current) use of oral hypoglycemic drugs: Secondary | ICD-10-CM | POA: Diagnosis not present

## 2022-11-28 DIAGNOSIS — I10 Essential (primary) hypertension: Secondary | ICD-10-CM | POA: Diagnosis not present

## 2022-11-28 NOTE — Progress Notes (Signed)
New Patient Office Visit  Subjective    Patient ID: Patricia Estrada, female    DOB: January 25, 1976  Age: 47 y.o. MRN: [id number]  CC:  Chief Complaint  Patient presents with   Establish Care    HPI Patricia Estrada presents to establish care  She has a gynecologist that does her pap smears.  Pt has hx of HTN and taking Lotrel 10 / 20 mg daily. Blood pressure at goal today. She has hx of HLD and is taking Atorvastatin 20 mg daily, tolerating well. She has hx of eczema and using clobetasol 0.05% BID prn.  She is using Mounjaro 5mg  weekly for diabetes. Last A1c in November 2023 was 4.6.     Outpatient Encounter Medications as of 11/28/2022  Medication Sig   amLODipine-benazepril (LOTREL) 10-20 MG capsule Take 1 capsule by mouth daily.   atorvastatin (LIPITOR) 20 MG tablet Take 1 tablet (20 mg total) by mouth daily.   clobetasol cream (TEMOVATE) 0.05 % Apply 1 Application topically 2 (two) times daily.   glucose blood test strip Use as instructed   levonorgestrel (MIRENA) 20 MCG/DAY IUD 1 each by Intrauterine route once.   tirzepatide [facility]) 5 MG/0.5ML Pen Inject 1 pen (5 mg) into the skin once a week.   ondansetron (ZOFRAN) 4 MG tablet Take 1 tablet (4 mg total) by mouth every 8 (eight) hours as needed for nausea. (Patient not taking: Reported on 11/28/2022)   No facility-administered encounter medications on file as of 11/28/2022.    Past Medical History:  Diagnosis Date   Allergy    Penicillin   Eczema    Essential hypertension    Gestational diabetes    Type 2 diabetes mellitus (HCC)     Past Surgical History:  Procedure Laterality Date   EYE SURGERY     WISDOM TOOTH EXTRACTION      Family History  Problem Relation Age of Onset   Diabetes Mother    Cancer Mother    Asthma Mother     Social History   Socioeconomic History   Marital status: Married    Spouse name: Not on file   Number of children: 1   Years of education: Not on file    Highest education level: Associate degree: academic program  Occupational History   Occupation: [employer]  Tobacco Use   Smoking status: Never    Passive exposure: Never   Smokeless tobacco: Never  Vaping Use   Vaping status: Never Used  Substance and Sexual Activity   Alcohol use: No   Drug use: No   Sexual activity: Not Currently    Birth control/protection: I.U.D.    Comment: mirena  Other Topics Concern   Not on file  Social History Narrative   Not on file   Social Determinants of Health   Financial Resource Strain: Low Risk  (11/28/2022)   Overall Financial Resource Strain (CARDIA)    Difficulty of Paying Living Expenses: Not very hard  Food Insecurity: No Food Insecurity (11/28/2022)   Hunger Vital Sign    Worried About Running Out of Food in the Last Year: Never true    Ran Out of Food in the Last Year: Never true  Transportation Needs: No Transportation Needs (11/28/2022)   PRAPARE - Administrator, Civil Service (Medical): No    Lack of Transportation (Non-Medical): No  Physical Activity: Unknown (11/28/2022)   Exercise Vital Sign    Days of Exercise per Week: 0  days    Minutes of Exercise per Session: Not on file  Stress: No Stress Concern Present (11/28/2022)   [employer] of Occupational Health - Occupational Stress Questionnaire    Feeling of Stress : Only a little  Social Connections: Socially Isolated (11/28/2022)   Social Connection and Isolation Panel [NHANES]    Frequency of Communication with Friends and Family: Three times a week    Frequency of Social Gatherings with Friends and Family: Once a week    Attends Religious Services: Never    Database administrator or Organizations: No    Attends Engineer, structural: Not on file    Marital Status: Separated  Intimate Partner Violence: Not At Risk (10/23/2018)   Humiliation, Afraid, Rape, and Kick questionnaire    Fear of Current or Ex-Partner: No    Emotionally  Abused: No    Physically Abused: No    Sexually Abused: No    Review of Systems  All other systems reviewed and are negative.       Objective    BP 135/79   Pulse 72   Temp 98.1 F (36.7 C) (Oral)   Resp 18   Ht 5\' 9"  (1.753 m)   Wt 195 lb 3.2 oz (88.5 kg)   LMP  (Exact Date)   SpO2 100%   BMI 28.83 kg/m   Physical Exam Vitals and nursing note reviewed.  Constitutional:      Appearance: Normal appearance. She is normal weight.  HENT:     Head: Normocephalic and atraumatic.     Right Ear: Tympanic membrane, ear canal and external ear normal.     Left Ear: Tympanic membrane, ear canal and external ear normal.     Nose: Nose normal.     Mouth/Throat:     Mouth: Mucous membranes are moist.     Pharynx: Oropharynx is clear.  Eyes:     Conjunctiva/sclera: Conjunctivae normal.     Pupils: Pupils are equal, round, and reactive to light.  Cardiovascular:     Rate and Rhythm: Normal rate and regular rhythm.     Pulses: Normal pulses.     Heart sounds: Normal heart sounds.  Pulmonary:     Effort: Pulmonary effort is normal.     Breath sounds: Normal breath sounds.  Abdominal:     General: Abdomen is flat. Bowel sounds are normal.  Skin:    General: Skin is warm.     Capillary Refill: Capillary refill takes less than 2 seconds.  Neurological:     General: No focal deficit present.     Mental Status: She is alert and oriented to person, place, and time. Mental status is at baseline.  Psychiatric:        Mood and Affect: Mood normal.        Behavior: Behavior normal.        Thought Content: Thought content normal.        Judgment: Judgment normal.        Assessment & Plan:   Problem List Items Addressed This Visit       Cardiovascular and Mediastinum   Essential hypertension     Musculoskeletal and Integument   Eczema     Other   Hyperlipidemia   Other Visit Diagnoses     Encounter to establish care with new doctor    -  Primary   Type 2 diabetes  mellitus with hyperglycemia, without long-term current use of insulin (HCC)   (Chronic)  Encounter to establish care with new doctor  Type 2 diabetes mellitus with hyperglycemia, without long-term current use of insulin (HCC)  Essential hypertension  Flexural eczema  Mixed hyperlipidemia  Here to establish care. No concerns today To see back in a few weeks for CPE/Labs.  Return for Annual Physical.   Suzan Slick, MD

## 2022-12-05 ENCOUNTER — Encounter: Payer: Self-pay | Admitting: Family Medicine

## 2022-12-05 ENCOUNTER — Ambulatory Visit (INDEPENDENT_AMBULATORY_CARE_PROVIDER_SITE_OTHER): Payer: 59 | Admitting: Family Medicine

## 2022-12-05 VITALS — BP 131/82 | HR 80 | Temp 98.4°F | Resp 16 | Ht 69.0 in | Wt 192.0 lb

## 2022-12-05 DIAGNOSIS — E119 Type 2 diabetes mellitus without complications: Secondary | ICD-10-CM

## 2022-12-05 DIAGNOSIS — Z13220 Encounter for screening for lipoid disorders: Secondary | ICD-10-CM

## 2022-12-05 DIAGNOSIS — Z7985 Long-term (current) use of injectable non-insulin antidiabetic drugs: Secondary | ICD-10-CM | POA: Diagnosis not present

## 2022-12-05 DIAGNOSIS — Z136 Encounter for screening for cardiovascular disorders: Secondary | ICD-10-CM | POA: Diagnosis not present

## 2022-12-05 DIAGNOSIS — Z1231 Encounter for screening mammogram for malignant neoplasm of breast: Secondary | ICD-10-CM

## 2022-12-05 DIAGNOSIS — Z1211 Encounter for screening for malignant neoplasm of colon: Secondary | ICD-10-CM

## 2022-12-05 DIAGNOSIS — Z0000 Encounter for general adult medical examination without abnormal findings: Secondary | ICD-10-CM

## 2022-12-05 NOTE — Progress Notes (Signed)
Complete physical exam  Patient: Patricia Estrada   DOB: 1975/11/22   47 y.o. Female  MRN: [id number]  Subjective:    Chief Complaint  Patient presents with   Annual Exam    Fasting labs    Patricia Estrada is a 47 y.o. female who presents today for a complete physical exam. She reports consuming a general diet. The patient does not participate in regular exercise at present. She generally feels well. She reports sleeping well. She does not have additional problems to discuss today.  She would like referral for colonoscopy and mammogram. She is due for her pap smear due to HPV co-testing this year.   Most recent fall risk assessment:    11/28/2022    3:56 PM  Fall Risk   Falls in the past year? 0  Number falls in past yr: 0  Injury with Fall? 0  Risk for fall due to : No Fall Risks  Follow up Falls evaluation completed     Most recent depression screenings:    11/28/2022    3:57 PM 02/10/2022   11:39 AM  PHQ 2/9 Scores  PHQ - 2 Score 0 0  PHQ- 9 Score 1 2    Vision:Within last year and Dental: No current dental problems  Patient Active Problem List   Diagnosis Date Noted   Cervical high risk HPV (human papillomavirus) test positive 11/18/2021   Hyperlipidemia 10/18/2021   Eczema 10/18/2021   Essential hypertension 10/18/2017   Type 2 diabetes mellitus without complication, without long-term current use of insulin (HCC) 09/29/2017   Class 1 obesity with serious comorbidity and body mass index (BMI) of 31.0 to 31.9 in adult 09/29/2017   Past Medical History:  Diagnosis Date   Allergy    Penicillin   Eczema    Essential hypertension    Gestational diabetes    Type 2 diabetes mellitus (HCC)    Past Surgical History:  Procedure Laterality Date   EYE SURGERY     WISDOM TOOTH EXTRACTION     Social History   Tobacco Use   Smoking status: Never    Passive exposure: Never   Smokeless tobacco: Never  Vaping Use   Vaping status: Never Used   Substance Use Topics   Alcohol use: No   Drug use: No   Family Status  Relation Name Status   Father  Deceased   Mother mom Alive   MGM  Alive   MGF  Alive   PGM  Deceased   PGF  Deceased  No partnership data on file   Family History  Problem Relation Age of Onset   Diabetes Mother    Cancer Mother    Asthma Mother    Allergies  Allergen Reactions   Penicillins Other (See Comments)    childhood childhood      Patient Care Team: Suzan Slick, MD as PCP - General (Family Medicine)   Outpatient Medications Prior to Visit  Medication Sig   amLODipine-benazepril (LOTREL) 10-20 MG capsule Take 1 capsule by mouth daily.   atorvastatin (LIPITOR) 20 MG tablet Take 1 tablet (20 mg total) by mouth daily.   clobetasol cream (TEMOVATE) 0.05 % Apply 1 Application topically 2 (two) times daily.   glucose blood test strip Use as instructed   levonorgestrel (MIRENA) 20 MCG/DAY IUD 1 each by Intrauterine route once.   ondansetron (ZOFRAN) 4 MG tablet Take 1 tablet (4 mg total) by mouth every 8 (eight) hours as needed for  nausea.   tirzepatide [facility]) 5 MG/0.5ML Pen Inject 1 pen (5 mg) into the skin once a week.   No facility-administered medications prior to visit.    Review of Systems  All other systems reviewed and are negative.        Objective:     BP 131/82   Pulse 80   Temp 98.4 F (36.9 C) (Oral)   Resp 16   Ht 5\' 9"  (1.753 m)   Wt 192 lb (87.1 kg)   LMP  (Exact Date)   SpO2 100%   BMI 28.35 kg/m  BP Readings from Last 3 Encounters:  12/05/22 131/82  11/28/22 135/79  02/10/22 126/80      Physical Exam Vitals and nursing note reviewed.  Constitutional:      Appearance: Normal appearance. She is normal weight.  HENT:     Head: Normocephalic and atraumatic.     Right Ear: Tympanic membrane, ear canal and external ear normal.     Left Ear: Tympanic membrane, ear canal and external ear normal.     Nose: Nose normal.     Mouth/Throat:      Mouth: Mucous membranes are moist.     Pharynx: Oropharynx is clear.  Eyes:     Conjunctiva/sclera: Conjunctivae normal.     Pupils: Pupils are equal, round, and reactive to light.  Cardiovascular:     Rate and Rhythm: Normal rate and regular rhythm.     Pulses: Normal pulses.     Heart sounds: Normal heart sounds.  Pulmonary:     Effort: Pulmonary effort is normal.     Breath sounds: Normal breath sounds.  Abdominal:     General: Abdomen is flat. Bowel sounds are normal.  Skin:    General: Skin is warm.     Capillary Refill: Capillary refill takes less than 2 seconds.  Neurological:     General: No focal deficit present.     Mental Status: She is alert and oriented to person, place, and time. Mental status is at baseline.  Psychiatric:        Mood and Affect: Mood normal.        Behavior: Behavior normal.        Thought Content: Thought content normal.        Judgment: Judgment normal.     No results found for any visits on 12/05/22.     Assessment & Plan:    Routine Health Maintenance and Physical Exam  Immunization History  Administered Date(s) Administered   HPV 9-valent 12/27/2021, 02/28/2022   Influenza Split 01/22/2013   Influenza,inj,Quad PF,6+ Mos 01/20/2021   Influenza,trivalent, recombinat, inj, PF 01/22/2013   Influenza-Unspecified 01/24/2022   PFIZER(Purple Top)SARS-COV-2 Vaccination 07/26/2019, 09/13/2020   Pneumococcal Polysaccharide-23 09/29/2017   Td 09/29/2017   Tdap 05/12/2008    Health Maintenance  Topic Date Due   OPHTHALMOLOGY EXAM  Never done   HIV Screening  Never done   Hepatitis C Screening  Never done   Colonoscopy  Never done   FOOT EXAM  06/03/2022   HPV VACCINES (3 - 3-dose SCDM series) 06/27/2022   HEMOGLOBIN A1C  08/11/2022   Diabetic kidney evaluation - eGFR measurement  10/19/2022   Diabetic kidney evaluation - Urine ACR  10/19/2022   INFLUENZA VACCINE  11/10/2022   PAP SMEAR-Modifier  11/16/2024   DTaP/Tdap/Td (3 - Td or  Tdap) 09/30/2027   COVID-19 Vaccine  Discontinued    Discussed health benefits of physical activity, and encouraged her to engage  in regular exercise appropriate for her age and condition.  Problem List Items Addressed This Visit   None  No follow-ups on file. Annual physical exam  Encounter for lipid screening for cardiovascular disease -     Lipid panel  Type 2 diabetes mellitus without complication, without long-term current use of insulin (HCC) -     CBC with Differential/Platelet -     Comprehensive metabolic panel -     Hemoglobin A1c  Screening for colon cancer -     Ambulatory referral to Gastroenterology  Screening mammogram for breast cancer -     3D Screening Mammogram, Left and Right; Future   Screening labs Refer for colonoscopy and mammogram See in 6 months sooner prn    Suzan Slick, MD

## 2022-12-06 LAB — CBC WITH DIFFERENTIAL/PLATELET
Basophils Absolute: 0 10*3/uL (ref 0.0–0.2)
Basos: 1 %
EOS (ABSOLUTE): 0.1 10*3/uL (ref 0.0–0.4)
Eos: 2 %
Hematocrit: 32.3 % — ABNORMAL LOW (ref 34.0–46.6)
Hemoglobin: 10.6 g/dL — ABNORMAL LOW (ref 11.1–15.9)
Immature Grans (Abs): 0 10*3/uL (ref 0.0–0.1)
Immature Granulocytes: 0 %
Lymphocytes Absolute: 1.4 10*3/uL (ref 0.7–3.1)
Lymphs: 40 %
MCH: 31 pg (ref 26.6–33.0)
MCHC: 32.8 g/dL (ref 31.5–35.7)
MCV: 94 fL (ref 79–97)
Monocytes Absolute: 0.2 10*3/uL (ref 0.1–0.9)
Monocytes: 6 %
Neutrophils Absolute: 1.7 10*3/uL (ref 1.4–7.0)
Neutrophils: 51 %
RBC: 3.42 x10E6/uL — ABNORMAL LOW (ref 3.77–5.28)
RDW: 11.4 % — ABNORMAL LOW (ref 11.7–15.4)
WBC: 3.4 10*3/uL (ref 3.4–10.8)

## 2022-12-06 LAB — COMPREHENSIVE METABOLIC PANEL
ALT: 17 IU/L (ref 0–32)
AST: 19 IU/L (ref 0–40)
Albumin: 4.2 g/dL (ref 3.9–4.9)
Alkaline Phosphatase: 72 IU/L (ref 44–121)
BUN/Creatinine Ratio: 11 (ref 9–23)
BUN: 9 mg/dL (ref 6–24)
Bilirubin Total: 0.4 mg/dL (ref 0.0–1.2)
CO2: 20 mmol/L (ref 20–29)
Calcium: 9.1 mg/dL (ref 8.7–10.2)
Chloride: 105 mmol/L (ref 96–106)
Creatinine, Ser: 0.85 mg/dL (ref 0.57–1.00)
Globulin, Total: 2.4 g/dL (ref 1.5–4.5)
Glucose: 82 mg/dL (ref 70–99)
Potassium: 4.3 mmol/L (ref 3.5–5.2)
Sodium: 138 mmol/L (ref 134–144)
Total Protein: 6.6 g/dL (ref 6.0–8.5)
eGFR: 86 mL/min/{1.73_m2} (ref >59)

## 2022-12-06 LAB — LIPID PANEL
Chol/HDL Ratio: 4 ratio (ref 0.0–4.4)
Cholesterol, Total: 194 mg/dL (ref 100–199)
HDL: 48 mg/dL (ref >39)
LDL Chol Calc (NIH): 137 mg/dL — ABNORMAL HIGH (ref 0–99)
Triglycerides: 46 mg/dL (ref 0–149)
VLDL Cholesterol Cal: 9 mg/dL (ref 5–40)

## 2022-12-06 LAB — HEMOGLOBIN A1C
Est. average glucose Bld gHb Est-mCnc: 91 mg/dL
Hgb A1c MFr Bld: 4.8 % (ref 4.8–5.6)

## 2022-12-21 ENCOUNTER — Other Ambulatory Visit (HOSPITAL_COMMUNITY): Payer: Self-pay

## 2023-01-24 ENCOUNTER — Other Ambulatory Visit: Payer: Self-pay | Admitting: Registered Nurse

## 2023-01-24 ENCOUNTER — Other Ambulatory Visit: Payer: Self-pay | Admitting: Family Medicine

## 2023-01-24 DIAGNOSIS — E119 Type 2 diabetes mellitus without complications: Secondary | ICD-10-CM

## 2023-01-24 DIAGNOSIS — L309 Dermatitis, unspecified: Secondary | ICD-10-CM

## 2023-01-27 ENCOUNTER — Other Ambulatory Visit: Payer: Self-pay | Admitting: Family Medicine

## 2023-01-27 DIAGNOSIS — E119 Type 2 diabetes mellitus without complications: Secondary | ICD-10-CM

## 2023-02-10 ENCOUNTER — Encounter: Payer: Self-pay | Admitting: Family Medicine

## 2023-02-10 ENCOUNTER — Other Ambulatory Visit: Payer: Self-pay | Admitting: Family Medicine

## 2023-02-10 DIAGNOSIS — E119 Type 2 diabetes mellitus without complications: Secondary | ICD-10-CM

## 2023-02-13 ENCOUNTER — Other Ambulatory Visit (HOSPITAL_COMMUNITY): Payer: Self-pay

## 2023-02-13 MED ORDER — MOUNJARO 5 MG/0.5ML ~~LOC~~ SOAJ
5.0000 mg | SUBCUTANEOUS | 3 refills | Status: DC
  Filled 2023-02-13: qty 6, 84d supply, fill #0
  Filled 2023-05-04: qty 6, 84d supply, fill #1
  Filled 2023-07-27: qty 6, 84d supply, fill #2
  Filled 2023-10-19: qty 6, 84d supply, fill #3

## 2023-02-28 ENCOUNTER — Other Ambulatory Visit (HOSPITAL_COMMUNITY): Payer: Self-pay

## 2023-03-30 ENCOUNTER — Other Ambulatory Visit (HOSPITAL_BASED_OUTPATIENT_CLINIC_OR_DEPARTMENT_OTHER): Payer: Self-pay

## 2023-05-06 ENCOUNTER — Other Ambulatory Visit (HOSPITAL_COMMUNITY): Payer: Self-pay

## 2023-05-17 ENCOUNTER — Other Ambulatory Visit (HOSPITAL_BASED_OUTPATIENT_CLINIC_OR_DEPARTMENT_OTHER): Payer: Self-pay

## 2023-05-17 ENCOUNTER — Ambulatory Visit (HOSPITAL_BASED_OUTPATIENT_CLINIC_OR_DEPARTMENT_OTHER)
Admission: EM | Admit: 2023-05-17 | Discharge: 2023-05-17 | Disposition: A | Discharge status: Home / Self Care | Payer: 59 | Attending: Emergency Medicine | Admitting: Emergency Medicine

## 2023-05-17 ENCOUNTER — Ambulatory Visit (HOSPITAL_BASED_OUTPATIENT_CLINIC_OR_DEPARTMENT_OTHER): Payer: 59

## 2023-05-17 ENCOUNTER — Encounter (HOSPITAL_BASED_OUTPATIENT_CLINIC_OR_DEPARTMENT_OTHER): Payer: Self-pay

## 2023-05-17 DIAGNOSIS — J069 Acute upper respiratory infection, unspecified: Secondary | ICD-10-CM

## 2023-05-17 MED ORDER — BENZONATATE 100 MG PO CAPS
100.0000 mg | ORAL_CAPSULE | Freq: Three times a day (TID) | ORAL | 0 refills | Status: AC | PRN
  Filled 2023-05-17: qty 30, 10d supply, fill #0

## 2023-05-17 MED ORDER — PROMETHAZINE-DM 6.25-15 MG/5ML PO SYRP
5.0000 mL | ORAL_SOLUTION | Freq: Four times a day (QID) | ORAL | 0 refills | Status: AC | PRN
Start: 2023-05-17 — End: ?
  Filled 2023-05-17: qty 240, 12d supply, fill #0

## 2023-05-17 NOTE — ED Provider Notes (Signed)
 [facility]    CSN: [id number] Arrival date & time: 05/17/23  1102      History   Chief Complaint Chief Complaint  Patient presents with   Cough   Sore Throat   Otalgia    HPI Patricia Estrada is a 48 y.o. female.  5 day history of sore throat, ear discomfort, nasal congestion and drainage, somewhat productive cough. The most persistent symptom is cough. She is not short of breath or wheezing. No asthma history.  Has not had fever Used theraflu that helped   Possible sick contacts at work - usually works from home but went into office a few days before symptoms began  Past Medical History:  Diagnosis Date   Allergy    Penicillin   Eczema    Essential hypertension    Gestational diabetes    Type 2 diabetes mellitus (HCC)     Patient Active Problem List   Diagnosis Date Noted   Cervical high risk HPV (human papillomavirus) test positive 11/18/2021   Hyperlipidemia 10/18/2021   Eczema 10/18/2021   Essential hypertension 10/18/2017   Type 2 diabetes mellitus without complication, without long-term current use of insulin (HCC) 09/29/2017   Class 1 obesity with serious comorbidity and body mass index (BMI) of 31.0 to 31.9 in adult 09/29/2017    Past Surgical History:  Procedure Laterality Date   EYE SURGERY     WISDOM TOOTH EXTRACTION      OB History     Gravida  1   Para  1   Term  1   Preterm      AB      Living  1      SAB      IAB      Ectopic      Multiple      Live Births  1            Home Medications    Prior to Admission medications   Medication Sig Start Date End Date Taking? Authorizing Provider  benzonatate  (TESSALON ) 100 MG capsule Take 1 capsule (100 mg total) by mouth 3 (three) times daily as needed for cough. 05/17/23  Yes Sony Schlarb, Asberry, PA-C  promethazine -dextromethorphan (PROMETHAZINE -DM) 6.25-15 MG/5ML syrup Take 5 mLs by mouth 4 (four) times daily as needed for cough. 05/17/23  Yes Ayane Delancey, Asberry,  PA-C  amLODipine -benazepril  (LOTREL ) 10-20 MG capsule Take 1 capsule by mouth daily. 02/10/22   Nche, Roselie Rockford, NP  atorvastatin  (LIPITOR) 20 MG tablet Take 1 tablet (20 mg total) by mouth daily. 02/10/22   Nche, Roselie Rockford, NP  clobetasol  cream (TEMOVATE ) 0.05 % Apply 1 Application topically 2 (two) times daily. 10/18/21   Kip Ade, NP  glucose blood test strip Use as instructed 11/12/17   Wiseman, Brittany D, PA-C  levonorgestrel  (MIRENA ) 20 MCG/DAY IUD 1 each by Intrauterine route once.    [provider]  ondansetron  (ZOFRAN ) 4 MG tablet Take 1 tablet (4 mg total) by mouth every 8 (eight) hours as needed for nausea. 02/10/22   Nche, Roselie Rockford, NP  tirzepatide  (MOUNJARO ) 5 MG/0.5ML Pen Inject 1 pen (5 mg) into the skin once a week. 02/13/23   Colette Torrence GRADE, MD    Family History Family History  Problem Relation Age of Onset   Diabetes Mother    Cancer Mother    Asthma Mother     Social History Social History   Tobacco Use   Smoking status: Never    Passive  exposure: Never   Smokeless tobacco: Never  Vaping Use   Vaping status: Never Used  Substance Use Topics   Alcohol use: No   Drug use: No     Allergies   Penicillins   Review of Systems Review of Systems Per HPI  Physical Exam Triage Vital Signs ED Triage Vitals  Encounter Vitals Group     BP 05/17/23 1311 (!) 166/82     Systolic BP Percentile --      Diastolic BP Percentile --      Pulse Rate 05/17/23 1311 85     Resp 05/17/23 1311 20     Temp 05/17/23 1311 98.7 F (37.1 C)     Temp src --      SpO2 05/17/23 1311 99 %     Weight --      Height --      Head Circumference --      Peak Flow --      Pain Score 05/17/23 1314 4     Pain Loc --      Pain Education --      Exclude from Growth Chart --    No data found.  Updated Vital Signs BP (!) 166/82 (BP Location: Left Arm)   Pulse 85   Temp 98.7 F (37.1 C)   Resp 20   SpO2 99%    Physical Exam Vitals and nursing  note reviewed.  Constitutional:      General: She is not in acute distress.    Appearance: She is not ill-appearing.  HENT:     Right Ear: Tympanic membrane and ear canal normal.     Left Ear: Tympanic membrane and ear canal normal.     Nose: No rhinorrhea.     Mouth/Throat:     Mouth: Mucous membranes are moist.     Pharynx: Oropharynx is clear. No posterior oropharyngeal erythema.  Eyes:     Conjunctiva/sclera: Conjunctivae normal.  Cardiovascular:     Rate and Rhythm: Normal rate and regular rhythm.     Pulses: Normal pulses.     Heart sounds: Normal heart sounds.  Pulmonary:     Effort: Pulmonary effort is normal. No respiratory distress.     Breath sounds: Normal breath sounds. No wheezing, rhonchi or rales.  Musculoskeletal:     Cervical back: Normal range of motion.  Lymphadenopathy:     Cervical: No cervical adenopathy.  Skin:    General: Skin is warm and dry.  Neurological:     Mental Status: She is alert and oriented to person, place, and time.      UC Treatments / Results  Labs (all labs ordered are listed, but only abnormal results are displayed) Labs Reviewed - No data to display  EKG   Radiology No results found.  Procedures Procedures (including critical care time)  Medications Ordered in UC Medications - No data to display  Initial Impression / Assessment and Plan / UC Course  I have reviewed the triage vital signs and the nursing notes.  Pertinent labs & imaging results that were available during my care of the patient were reviewed by me and considered in my medical decision making (see chart for details).  Afebrile, well appearing, lungs are clear. Sating 99% on room air. Discussed reassuring; viral etiology and continue symptomatic care. Try promethazine  DM and tessalon . Also can try mucinex as expectorant. Increase fluids. Discussed reasons to return to clinic. Patient is agreeable, no questions at this time  Final Clinical  Impressions(s) /  UC Diagnoses   Final diagnoses:  Viral URI with cough     Discharge Instructions      The tessalon  cough pills can be taken 3x daily.  The promethazine  DM cough syrup can be used up to 4 times daily. If this medication makes you drowsy, take only once before bed.  You can also use plain mucinex (guaifenesin) as an expectorant!  Take with lots of fluids  If over next 5 days symptoms have worsened, or you spike new fever, please return     ED Prescriptions     Medication Sig Dispense Auth. Provider   benzonatate  (TESSALON ) 100 MG capsule Take 1 capsule (100 mg total) by mouth 3 (three) times daily as needed for cough. 30 capsule Mukund Weinreb, PA-C   promethazine -dextromethorphan (PROMETHAZINE -DM) 6.25-15 MG/5ML syrup Take 5 mLs by mouth 4 (four) times daily as needed for cough. 240 mL Russell Engelstad, Asberry, PA-C      PDMP not reviewed this encounter.   Jeryl Asberry, PA-C 05/17/23 1406

## 2023-05-17 NOTE — Discharge Instructions (Signed)
 The tessalon  cough pills can be taken 3x daily.  The promethazine  DM cough syrup can be used up to 4 times daily. If this medication makes you drowsy, take only once before bed.  You can also use plain mucinex (guaifenesin) as an expectorant!  Take with lots of fluids  If over next 5 days symptoms have worsened, or you spike new fever, please return

## 2023-05-17 NOTE — ED Triage Notes (Signed)
 Onset of cough, sore throat, congestion, Friday. Taking OTC Thera Flu for symptoms.

## 2023-06-07 ENCOUNTER — Ambulatory Visit: Payer: 59 | Admitting: Family Medicine

## 2023-12-06 ENCOUNTER — Encounter: Payer: 59 | Admitting: Family Medicine

## 2024-01-15 ENCOUNTER — Other Ambulatory Visit: Payer: Self-pay | Admitting: Nurse Practitioner

## 2024-01-15 ENCOUNTER — Other Ambulatory Visit: Payer: Self-pay | Admitting: Family Medicine

## 2024-01-15 DIAGNOSIS — I10 Essential (primary) hypertension: Secondary | ICD-10-CM

## 2024-01-15 DIAGNOSIS — E119 Type 2 diabetes mellitus without complications: Secondary | ICD-10-CM

## 2024-01-15 DIAGNOSIS — E785 Hyperlipidemia, unspecified: Secondary | ICD-10-CM

## 2024-01-16 ENCOUNTER — Other Ambulatory Visit: Payer: Self-pay | Admitting: Family Medicine

## 2024-01-16 DIAGNOSIS — E119 Type 2 diabetes mellitus without complications: Secondary | ICD-10-CM

## 2024-01-17 ENCOUNTER — Encounter: Payer: Self-pay | Admitting: Family Medicine

## 2024-01-17 ENCOUNTER — Telehealth: Payer: Self-pay

## 2024-01-17 NOTE — Telephone Encounter (Signed)
 Called patient and LVM for patient to call office .  Patient is requesting a refill on Mounjaro  but has not seen Dr. Colette since 12/05/2022 no showed appointment scheduled for 06/07/2023, Patient also seems to have established with another Primary care Provider.   Please Advise

## 2024-01-18 ENCOUNTER — Other Ambulatory Visit (HOSPITAL_COMMUNITY): Payer: Self-pay

## 2024-01-18 MED ORDER — ATORVASTATIN CALCIUM 20 MG PO TABS
20.0000 mg | ORAL_TABLET | Freq: Every day | ORAL | 0 refills | Status: AC
  Filled 2024-01-18: qty 90, 90d supply, fill #0

## 2024-01-18 MED ORDER — AMLODIPINE BESY-BENAZEPRIL HCL 10-20 MG PO CAPS
1.0000 | ORAL_CAPSULE | Freq: Every day | ORAL | 0 refills | Status: AC
  Filled 2024-01-18 (×2): qty 90, 90d supply, fill #0

## 2024-01-18 NOTE — Telephone Encounter (Signed)
 Attempted to contact patient but unfortunately was sent to voicemail. I have left a detailed voice message requesting a call back to our office in regards to an annual exam that is needed for further refills.

## 2024-01-19 ENCOUNTER — Other Ambulatory Visit: Payer: Self-pay | Admitting: Family Medicine

## 2024-01-19 ENCOUNTER — Other Ambulatory Visit: Payer: Self-pay

## 2024-01-19 ENCOUNTER — Other Ambulatory Visit (HOSPITAL_COMMUNITY): Payer: Self-pay

## 2024-01-19 DIAGNOSIS — E119 Type 2 diabetes mellitus without complications: Secondary | ICD-10-CM

## 2024-01-24 ENCOUNTER — Other Ambulatory Visit: Payer: Self-pay | Admitting: Family Medicine

## 2024-01-24 DIAGNOSIS — E119 Type 2 diabetes mellitus without complications: Secondary | ICD-10-CM

## 2024-01-24 NOTE — Telephone Encounter (Signed)
 Copied from CRM #[id number]. Topic: Clinical - Medication Refill >> Jan 24, 2024 12:17 PM Jasmin G wrote: Medication: tirzepatide  (MOUNJARO ) 5 MG/0.5ML Pen  Has the patient contacted their pharmacy? Yes (Agent: If no, request that the patient contact the pharmacy for the refill. If patient does not wish to contact the pharmacy document the reason why and proceed with request.) (Agent: If yes, when and what did the pharmacy advise?)  This is the patient's preferred pharmacy:  [facility] [address] [place] [address] Phone: [phone] Fax: [phone]  Is this the correct pharmacy for this prescription? Yes If no, delete pharmacy and type the correct one.   Has the prescription been filled recently? Yes  Is the patient out of the medication? Yes  Has the patient been seen for an appointment in the last year OR does the patient have an upcoming appointment? Yes  Can we respond through MyChart? Yes  Agent: Please be advised that Rx refills may take up to 3 business days. We ask that you follow-up with your pharmacy.

## 2024-01-25 ENCOUNTER — Other Ambulatory Visit: Payer: Self-pay

## 2024-01-25 MED ORDER — MOUNJARO 5 MG/0.5ML ~~LOC~~ SOAJ
5.0000 mg | SUBCUTANEOUS | 3 refills | Status: AC
  Filled 2024-01-25: qty 6, 84d supply, fill #0
  Filled 2024-04-15: qty 6, 84d supply, fill #1

## 2024-01-27 ENCOUNTER — Other Ambulatory Visit (HOSPITAL_COMMUNITY): Payer: Self-pay

## 2024-04-15 ENCOUNTER — Other Ambulatory Visit: Payer: Self-pay

## 2024-04-15 DIAGNOSIS — E785 Hyperlipidemia, unspecified: Secondary | ICD-10-CM

## 2024-04-15 DIAGNOSIS — I10 Essential (primary) hypertension: Secondary | ICD-10-CM

## 2024-04-18 ENCOUNTER — Other Ambulatory Visit (HOSPITAL_COMMUNITY): Payer: Self-pay
